# Patient Record
Sex: Male | Born: 1950 | Race: White | Hispanic: No | Marital: Married | Smoking: Current every day smoker
Health system: Southern US, Community
[De-identification: ages and names within clinical notes are randomized; demographics above are authoritative.]

## PROBLEM LIST (undated history)

## (undated) DIAGNOSIS — I252 Old myocardial infarction: Secondary | ICD-10-CM

## (undated) DIAGNOSIS — R030 Elevated blood-pressure reading, without diagnosis of hypertension: Secondary | ICD-10-CM

## (undated) HISTORY — DX: Old myocardial infarction: I25.2

## (undated) HISTORY — DX: Elevated blood-pressure reading, without diagnosis of hypertension: R03.0

---

## 2004-10-09 ENCOUNTER — Emergency Department (HOSPITAL_COMMUNITY): Admission: EM | Admit: 2004-10-09 | Discharge: 2004-10-09 | Payer: Self-pay | Admitting: Emergency Medicine

## 2004-12-19 ENCOUNTER — Emergency Department (HOSPITAL_COMMUNITY): Admission: EM | Admit: 2004-12-19 | Discharge: 2004-12-19 | Payer: Self-pay | Admitting: *Deleted

## 2006-12-11 ENCOUNTER — Emergency Department (HOSPITAL_COMMUNITY): Admission: EM | Admit: 2006-12-11 | Discharge: 2006-12-11 | Payer: Self-pay | Admitting: Emergency Medicine

## 2006-12-31 ENCOUNTER — Emergency Department (HOSPITAL_COMMUNITY): Admission: EM | Admit: 2006-12-31 | Discharge: 2006-12-31 | Payer: Self-pay | Admitting: Emergency Medicine

## 2007-01-30 ENCOUNTER — Emergency Department (HOSPITAL_COMMUNITY): Admission: EM | Admit: 2007-01-30 | Discharge: 2007-01-30 | Payer: Self-pay | Admitting: Emergency Medicine

## 2007-08-22 ENCOUNTER — Emergency Department (HOSPITAL_COMMUNITY): Admission: EM | Admit: 2007-08-22 | Discharge: 2007-08-22 | Payer: Self-pay | Admitting: Family Medicine

## 2008-01-17 ENCOUNTER — Emergency Department (HOSPITAL_COMMUNITY): Admission: EM | Admit: 2008-01-17 | Discharge: 2008-01-17 | Payer: Self-pay | Admitting: Emergency Medicine

## 2008-01-19 ENCOUNTER — Emergency Department (HOSPITAL_COMMUNITY): Admission: EM | Admit: 2008-01-19 | Discharge: 2008-01-19 | Payer: Self-pay | Admitting: Emergency Medicine

## 2008-03-13 ENCOUNTER — Inpatient Hospital Stay (HOSPITAL_COMMUNITY): Admission: EM | Admit: 2008-03-13 | Discharge: 2008-03-16 | Payer: Self-pay | Admitting: Emergency Medicine

## 2008-03-14 ENCOUNTER — Encounter (INDEPENDENT_AMBULATORY_CARE_PROVIDER_SITE_OTHER): Payer: Self-pay | Admitting: Internal Medicine

## 2008-03-15 ENCOUNTER — Ambulatory Visit: Payer: Self-pay | Admitting: Vascular Surgery

## 2008-03-21 ENCOUNTER — Emergency Department (HOSPITAL_COMMUNITY): Admission: EM | Admit: 2008-03-21 | Discharge: 2008-03-21 | Payer: Self-pay | Admitting: Family Medicine

## 2009-04-19 ENCOUNTER — Ambulatory Visit: Payer: Self-pay | Admitting: Family Medicine

## 2009-08-22 ENCOUNTER — Emergency Department (HOSPITAL_BASED_OUTPATIENT_CLINIC_OR_DEPARTMENT_OTHER): Admission: EM | Admit: 2009-08-22 | Discharge: 2009-08-22 | Payer: Self-pay | Admitting: Emergency Medicine

## 2009-08-22 ENCOUNTER — Ambulatory Visit: Payer: Self-pay | Admitting: Diagnostic Radiology

## 2009-08-23 ENCOUNTER — Ambulatory Visit: Payer: Self-pay | Admitting: Diagnostic Radiology

## 2009-08-23 ENCOUNTER — Emergency Department (HOSPITAL_BASED_OUTPATIENT_CLINIC_OR_DEPARTMENT_OTHER): Admission: EM | Admit: 2009-08-23 | Discharge: 2009-08-23 | Payer: Self-pay | Admitting: Emergency Medicine

## 2009-10-20 ENCOUNTER — Ambulatory Visit: Payer: Self-pay | Admitting: Family Medicine

## 2009-11-14 ENCOUNTER — Emergency Department (HOSPITAL_BASED_OUTPATIENT_CLINIC_OR_DEPARTMENT_OTHER): Admission: EM | Admit: 2009-11-14 | Discharge: 2009-11-14 | Payer: Self-pay | Admitting: Emergency Medicine

## 2009-11-14 ENCOUNTER — Ambulatory Visit: Payer: Self-pay | Admitting: Radiology

## 2010-06-05 IMAGING — CR DG HAND COMPLETE 3+V*L*
3 series · 3 of 3 positions shown · non-contrast
Comparison: None.

CLINICAL DATA: Left and swelling and pain.

LEFT HAND - COMPLETE 3+ VIEW

[x hand pa left]
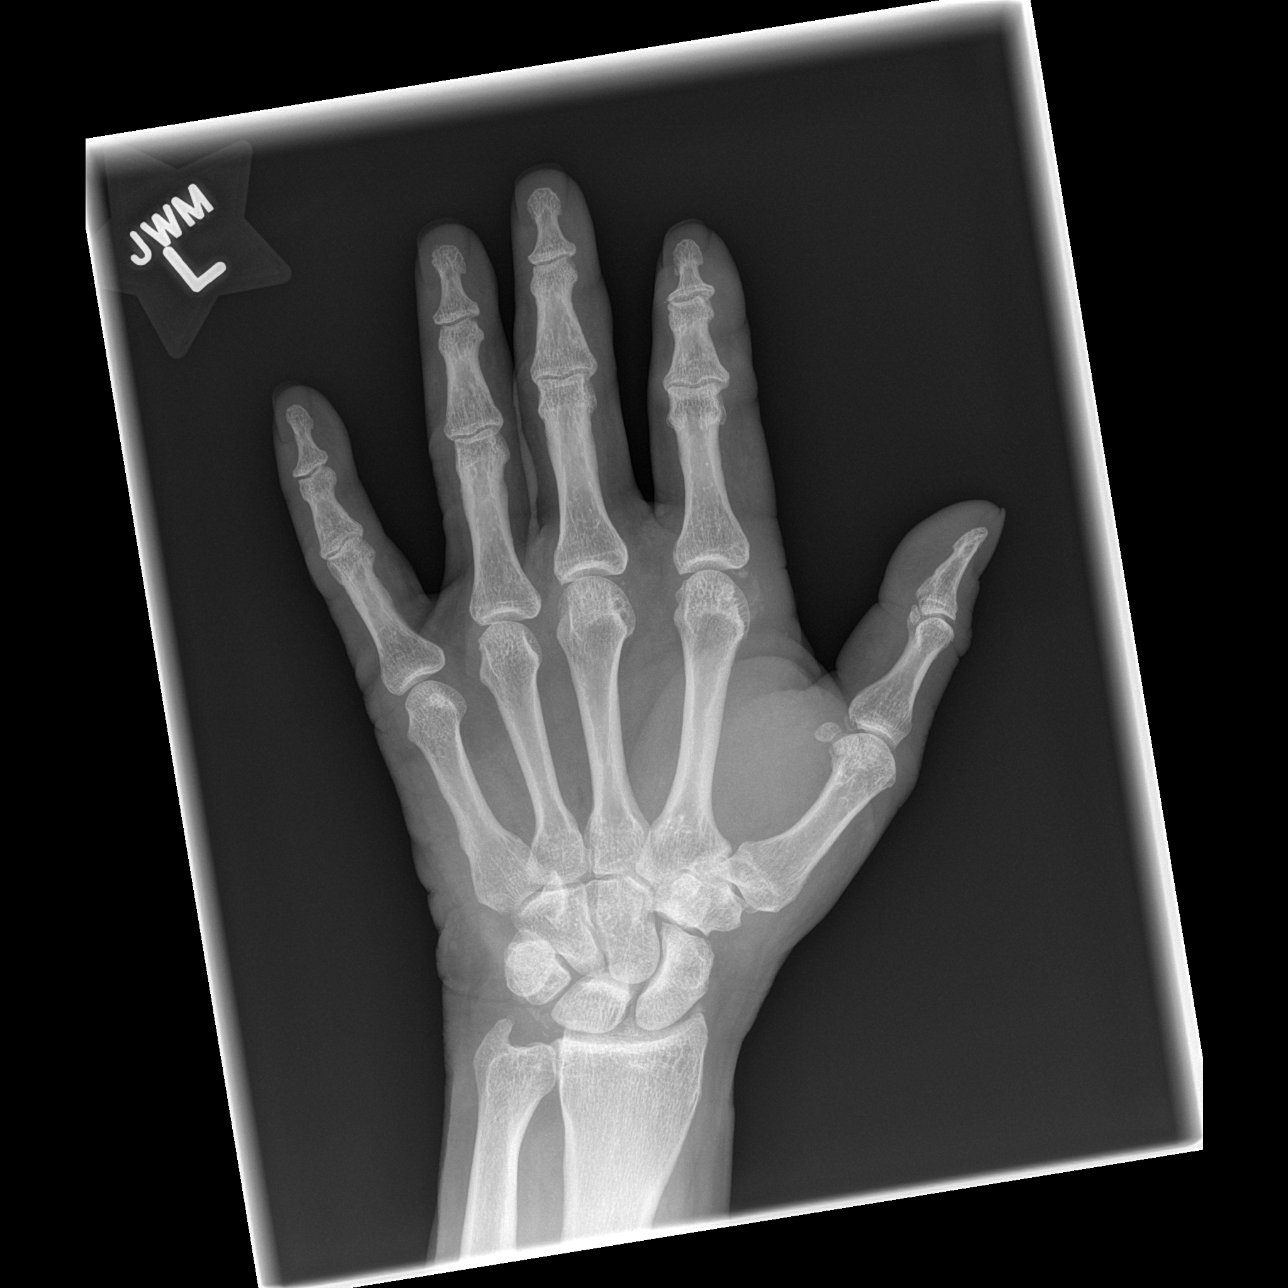

[x hand oblique left]
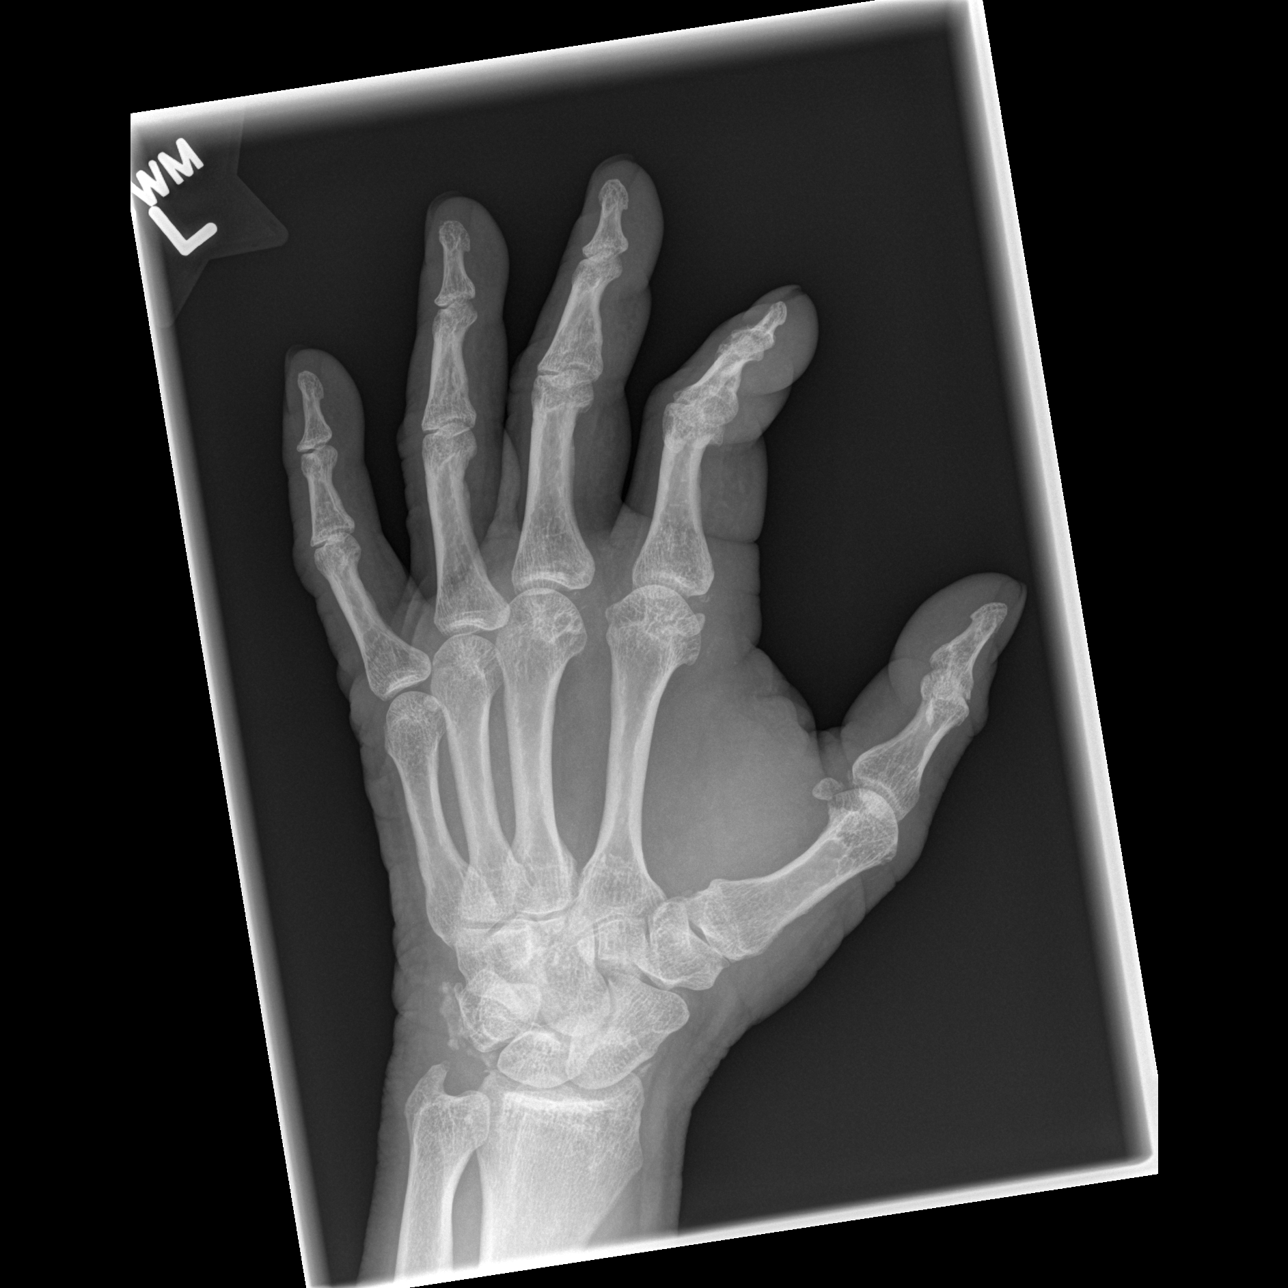

[x hand lat left]
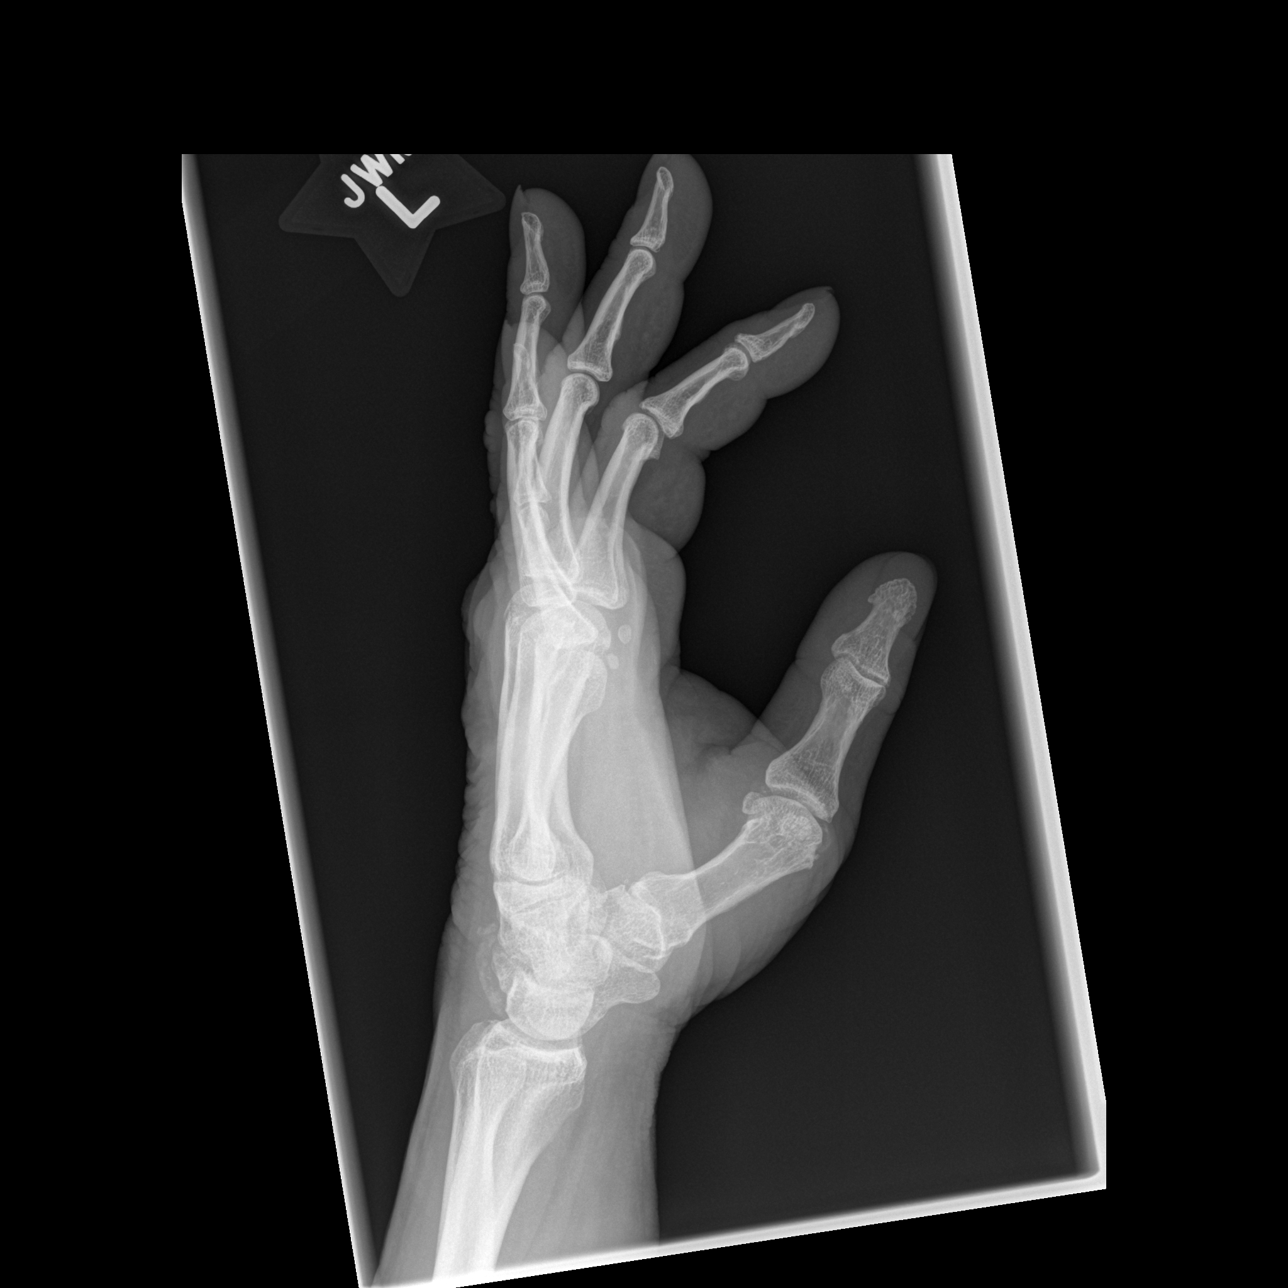

[3 of 3 positions shown; findings below may reference images not displayed]

FINDINGS: Calcific deposits adjacent to the second MCP joint may be
secondary to calcific periarthritis small nonspecific cystic like
foci in the third metacarpal head.  No joint space narrowing.
Calcification of the triangular fibrocartilage.
IMPRESSION: Combination of findings noted above may be due to CPPD.

## 2010-10-03 NOTE — Letter (Signed)
Summary: Out of Work  [facility]  [address]   [place], [place] [address]   Phone: [phone]  Fax: [phone]    October 20, 2009   Employee:  Carl Palmer    To Whom It May Concern:   For Medical reasons, please excuse the above named employee from work for the following dates:  Start:   20 Oct 2009  End:   23 Oct 2009 If you need additional information, please feel free to contact our office.         Sincerely,    Marvis Moeller DO

## 2010-10-03 NOTE — Assessment & Plan Note (Signed)
Summary: FEVER & COUGH/KH   Vital Signs:  Patient Profile:   60 Years Old Male CC:      fever, chills, fatigue, HAs, productive cough, N/VX 3 days Height:     69 inches Weight:      200 pounds O2 Sat:      96 % O2 treatment:    Room Air Temp:     100.9 degrees F oral Pulse rate:   115 / minute Pulse rhythm:   regular Resp:     14 per minute BP sitting:   110 / 76  (right arm) Cuff size:   regular  Pt. in pain?   yes    Location:   head    Type:       aching  Vitals Entered By: Lajean Saver, RN                   Updated Prior Medication List: DIOVAN HCT 80-12.5 MG TABS (VALSARTAN-HYDROCHLOROTHIAZIDE) once daily PREVACID 15 MG CPDR (LANSOPRAZOLE) once daily METFORMIN HCL 500 MG TABS (METFORMIN HCL)  * ASPIRIN 81MG  once a day LIPITOR 40 MG TABS (ATORVASTATIN CALCIUM) once daily DAYQUIL MULTI-SYMPTOM 30-325-10 MG/15ML LIQD (PSEUDOEPHEDRINE-APAP-DM) prn  Current Allergies (reviewed today): No known allergies History of Present Illness Chief Complaint: fever, chills, fatigue, HAs, productive cough, N/VX 3 days History of Present Illness: ONSET TUESDAY WITH FEVER TO 102 AND DRY COUGH. HAS BODYACHES. SOME RUNNY NOSE. NO SORE THROAT. TAKING NYQUIL. NO VOMITING OR DIARRHEA  REVIEW OF SYSTEMS Constitutional Symptoms       Complains of fever, chills, and fatigue.     Denies night sweats, weight loss, and weight gain.      Comments: body aches Eyes       Denies change in vision, eye pain, eye discharge, glasses, contact lenses, and eye surgery. Ear/Nose/Throat/Mouth       Complains of change in hearing, frequent runny nose, and sinus problems.      Denies hearing loss/aids, ear pain, ear discharge, dizziness, frequent nose bleeds, sore throat, hoarseness, and tooth pain or bleeding.      Comments: ears stopped up Respiratory       Complains of productive cough.      Denies dry cough, wheezing, shortness of breath, asthma, bronchitis, and emphysema/COPD.  Cardiovascular  Denies murmurs, chest pain, and tires easily with exhertion.    Gastrointestinal       Denies stomach pain, nausea/vomiting, diarrhea, constipation, blood in bowel movements, and indigestion. Genitourniary       Denies painful urination, kidney stones, and loss of urinary control. Neurological       Complains of headaches.      Denies paralysis, seizures, and fainting/blackouts. Musculoskeletal       Complains of muscle pain and joint pain.      Denies joint stiffness, decreased range of motion, redness, swelling, muscle weakness, and gout.  Skin       Denies bruising, unusual mles/lumps or sores, and hair/skin or nail changes.  Psych       Denies mood changes, temper/anger issues, anxiety/stress, speech problems, depression, and sleep problems. Other Comments: taken dayquil for symptoms   Past History:  Past Medical History: Reviewed history from 04/19/2009 and no changes required. diabetes elevated cholesterol elevated blood pressure  Past Surgical History: Reviewed history from 04/19/2009 and no changes required. ear surgery 20 years ago  Family History: Reviewed history from 04/19/2009 and no changes required. mother deceased father deceased brother adn sister alive  Social History: smokes 2 ppd denies drinking denies recreational drug use Physical Exam General appearance: well developed, well nourished, no acute distress Pupils: equal, round, reactive to light Nasal: CONGESTED Oral/Pharynx: tongue normal, posterior pharynx without erythema or exudate Neck: neck supple,  trachea midline, no masses Chest/Lungs: no rales, wheezes, or rhonchi bilateral, breath sounds equal without effort. CONGESTED COUGH Heart: regular rate and  rhythm, no murmur Extremities: normal extremities Skin: no obvious rashes or lesions Assessment New Problems: INFLUENZA (ICD-487.8)   Plan New Medications/Changes: CHERATUSSIN AC 100-10 MG/5ML SYRP (GUAIFENESIN-CODEINE)  #6 OZ x 0,  10/20/2009, Marvis Moeller DO  New Orders: Est. Patient Level III [51761]   Prescriptions: CHERATUSSIN AC 100-10 MG/5ML SYRP (GUAIFENESIN-CODEINE)   #6 OZ x 0   Entered and Authorized by:   Marvis Moeller DO   Signed by:   Marvis Moeller DO on 10/20/2009   Method used:   Print then Give to Patient   RxID:   [id number]   Patient Instructions: 1)  TYLENOL AT 12 AND 6, MOTRIN AT 3 ND 9. AVOID CAFFEINE AND MILK PRODUCTS. FOLLOW UP WITH YOUR PCP OR RETURN IF SYMPTOMS PERSIST OR WORSEN.

## 2011-01-16 NOTE — H&P (Signed)
NAMEATHANASIUS, [name]              ACCOUNT NO.:  [id number]   MEDICAL RECORD NO.:  [id number]          PATIENT TYPE:  EMS   LOCATION:  ED                           FACILITY[facility]  [facility]   PHYSICIAN:  Lonia Blood, M.D.DATE OF BIRTH:  May 20, 1951   DATE OF ADMISSION:  03/13/2008  DATE OF DISCHARGE:                              HISTORY & PHYSICAL   PRIMARY CARE PHYSICIAN:  Unassigned.   CHIEF COMPLAINT:  Left ankle swelling and erythema x6 days.   HISTORY OF PRESENT ILLNESS:  Mr. Carl Craze. Palmer is a very pleasant 60-  year-old diabetic with a reported history of gout who states that he has  suffered with a 6-day history of significant erythema and swelling about  the left ankle.  He does not remember specific trauma to the ankle to  include bug bites, scratches to the skin, or twisting or more  significant musculoskeletal-type trauma.  Regardless, he awoke last  Sunday to significant pain in the ankle with some swelling about the  lateral malleolus.  As time has progressed he began to note significant  erythema around both the medial and lateral aspect of the foot as well  as on the dorsal aspect of the foot as well.  He has noted episodic  shaking chills but has not checked his own temperature.  He has  occasionally used over-the-counter Tylenol for symptom relief.  He  presented to his primary care physician in [facility] at [facility] and  was given prescriptions for rifampin and Bactrim after an IM injection  of Rocephin.  Despite strict adherence to his antibiotic regimen the  patient states that the pain and swelling in his ankle and foot have  only increased.  He has continued to have episodic chills.  There has  been no eruption on the skin and no purulent discharge.  There is  significant pain about the ankle which limits his motion.   REVIEW OF SYSTEMS:  His comprehensive review of systems is unremarkable  with the exception of the positive elements noted in the history  of  present illness above.   PAST MEDICAL HISTORY:  1. Diabetes mellitus - poorly controlled.  2. Hypertension - poorly controlled.  3. Reported history of gout - uric acid normal at this time.  4. Excess of tobacco abuse - 2-3 pack per day history for multiple      years.  5. A remote history of surgical repair of the tympanic membranes      secondary to spontaneous rupture.  6.  No prior surgical history      otherwise.   OUTPATIENT MEDICATIONS:  Doses unclear.  1. Diovan.  2. Lipitor.  3. Januvia.  4. Bactrim DS b.i.d.  5. Rifampin 300 mg b.i.d.  6. Avandamet.   ALLERGIES:  No known drug allergies.   FAMILY HISTORY:  Noncontributory.   SOCIAL HISTORY:  [place]  He is  married.  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  He occasionally partakes alcohol  but not to excess.   DATA REVIEWED:  Sodium is low at  131, potassium chloride and bicarb are  normal.  BUN is borderline elevated at 19 with a creatinine of 1.05,  serum glucose is markedly elevated at 279 with normal calcium.  White  count is elevated at 12,000.  MCV is normal.  Hemoglobin is normal.  Platelet count is normal.  Uric acid level is low normal at 3.7.  Sed  rate is elevated at 46.  Left ankle x-rays raise the question of  possible osteomyelitis of the lateral talus as per the radiologist with  suggestion of bone scan versus MRI for further evaluation.   PHYSICAL EXAMINATION:  Temperature 98.6, blood pressure 139/78, heart  rate 110, respiratory rate 18, O2 sat 96% on room air.  CBG is 246.  GENERAL:  A well-developed, well-nourished gentleman no acute  respiratory distress.  HEENT:  Normocephalic, atraumatic.  Pupils equal, round, reactive to  light and accommodation.  Extraocular muscles intact bilaterally.  OC/OP  clear.  NECK:  No JVD.  LUNGS:  Clear to auscultation bilaterally without wheezes or rhonchi.  CARDIOVASCULAR:  Regular rate and rhythm without murmur, gallop or  rub  with a normal S1 and S2.  ABDOMEN:  Nontender, nondistended, soft.  Bowel sounds present.  No  hepatosplenomegaly.  No rebound, no ascites.  EXTREMITIES:  The left ankle is significantly edematous/swollen.  There  is no cutaneous edema but there is significant erythema about the entire  ankle.  The ankle is very tender to touch and the patient guards against  full clinical exam.  Dorsalis pedis pulses are 1+ bilaterally.  There is  no evidence of necrosis.  There are multiple lower extremity superficial  cutaneous wounds but no wounds in the immediate area of the  erythema/cellulitis.  The ankle is warm to touch, as is the dorsal  aspect of the foot.  There is no crepitance of the skin.  There is no  streaking up the calf at this time.  NEUROLOGIC:  Alert and oriented x4, nonfocal neurologic exam.   IMPRESSION AND PLAN:  1. Left ankle swelling with severe erythema - differential at this      time includes the possibility of  cellulitis versus a more complex      infection to include a deep abscess or an osteomyelitis.      Orthopedic surgery has been consulted in the ER and has evaluated      the patient.  They will follow along with [place] as needed as we pursue      evaluation further.  Broad-spectrum antibiotics for the probability      of a multi organism infection are administered in this diabetic.      We will proceed with an MRI as well as ABIs and further      intervention will be carried out as appropriate.  2. Uncontrolled diabetes - in the acute setting the patient's diabetes      will need to be strictly controlled.  Blood sugar must be      maintained at less than 150.  We will hold all of the patient's      p.o. medications and we will initiate Lantus insulin as well as a      strict sliding scale insulin.  It is likely the patient will      require further titration of his diabetic regimen.  It is not clear      if the patient is on a routine regimen of aspirin therapy.   We have  counseled him on this and will initiate this during his hospital      stay and I suggest that he continue this on a daily basis after his      discharge.  3. Hypertension - the patient's blood pressure is poorly controlled at      the present time.  We will initiate his usual ARB and follow his      blood pressure closely.  Additional agents may be required.  4. Tobacco abuse - the patient smokes extensively.  The patient has      been counseled as to the multiple deleterious      effects of ongoing tobacco abuse.  He has been counseled to the      direct connection between vascular disease, peripheral vascular      disease, poor wound healing, and a propensity for further      complications of his diabetes with ongoing tobacco abuse.  We will      place a nicotine patch during his hospital stay.      Lonia Blood, M.D.  Electronically Signed     JTM/MEDQ  D:  03/13/2008  T:  03/13/2008  Job:  [id number]

## 2011-01-16 NOTE — Consult Note (Signed)
Carl Palmer, Carl Palmer              ACCOUNT NO.:  [id number]   MEDICAL RECORD NO.:  [id number]          PATIENT TYPE:  EMS   LOCATION:  ED                           FACILITY:  [facility]   PHYSICIAN:  Ollen Gross, M.D.    DATE OF BIRTH:  [birth year]   DATE OF CONSULTATION:  03/13/2008  DATE OF DISCHARGE:                                 CONSULTATION   REASON FOR CONSULTATION:  Left foot swelling and erythema.   HISTORY OF PRESENT ILLNESS:  Mr. Champine is a 60 year old male with  noninsulin-dependent diabetes and gout, in today with an approximately 5-  6-day history of pain and swelling in his left ankle.  He does not  recall any specific injury which would have led to this.  He woke up  with some swelling and a little redness.  Over the ensuing 2-3 days it  got worse.  He denies any fever but has had a few episodes of chills.  He did have a dose of IV antibiotics at an urgent care and did not  notice any response to that.  He said he has had gout in the other leg  before and this does feel similar to a gouty episode.  He came into the  emergency room today because of no response to prior treatment and  progressive worsening of his condition.  He denies any calf pain or  thigh pain.  He is not having any shortness of breath or systemic  symptoms.   His medical history is significant for poorly-controlled noninsulin-  dependent diabetes and gout.   PHYSICAL EXAMINATION:  A very pleasant, well-developed male, alert and  oriented, in no apparent distress.  Evaluation of his left foot shows diffuse swelling in the foot.  He has  erythema over the lateral and medial malleoli.  He has significant  discomfort with any attempted palpation of the foot, any attempted  movement of the ankle.  I did not palpate any significant ankle  effusion.  He has intact pulses and sensation in the foot.  He has no  evidence of any tenderness in his calf and his Homans sign is negative.  There is no popliteal  tenderness either.   RADIOGRAPHS:  Three views of the left ankle:  No fracture or acute  trauma.  Radiology read that there is possible osteomyelitis, lateral  process of the talus, but to me this looks more like chronic attritional  changes from a possible prior sprain of the ankle.   ASSESSMENT:  Left foot swelling and erythema.  He either has a  significant gout flare, which is unlikely given his uric acid level of  3.7, or has a cellulitis and possible deep infection.  I agree with  admitting him, providing IV antibiotics and obtaining an MR to rule out  an abscess.  If indeed he has an abscess, then we could address that  with surgical drainage.  If there is not an abscess, then the treatment  of choice would be elevation and IV antibiotics.  We will follow along  with the hospitalist team.  Ollen Gross, M.D.  Electronically Signed     FA/MEDQ  D:  03/13/2008  T:  03/13/2008  Job:  [id number]   cc:   Lonia Blood, M.D.

## 2011-01-19 NOTE — Discharge Summary (Signed)
Carl Palmer, Carl Palmer              ACCOUNT NO.:  [id number]   MEDICAL RECORD NO.:  [id number]          PATIENT TYPE:  INP   LOCATION:  1303                         FACILITY:  [facility]   PHYSICIAN:  Lonia Blood, M.D.      DATE OF BIRTH:  1951-01-25   DATE OF ADMISSION:  03/13/2008  DATE OF DISCHARGE:  03/16/2008                               DISCHARGE SUMMARY   PRIMARY CARE PHYSICIAN:  The patient's primary care physician is in [facility], so he is unassigned to [place].   DISCHARGE DIAGNOSES:  1. Left ankle cellulitis.  2. Uncontrolled diabetes type 2.  3. Gout.  4. Hypertension.  5. History of tobacco abuse.  6. Hyponatremia, but presumed to be pseudo.   DISCHARGE MEDICATIONS:  1. Doxycycline 100 mg p.o. b.i.d. for 1 week.  2. Diovan HCT 12.5  daily.  3. Lipitor 20-mg tablet daily.  4. Januvia 30mg  mg daily.   DISPOSITION:  The patient is to complete antibiotics and follow up with  his primary care physician in about a week.  His cultures did not grow  anything significant.   CONSULTATIONS:  Dr. Ollen Gross, orthopedics, with no surgical  intervention recommended.   PROCEDURES PERFORMED:  1. Lower extremity Doppler ultrasound and ABIs that were found to be      normal bilaterally.  2. MRI/MRA of the lower extremities that showed cellulitis of the      ankle and foot with low-level edema and enhancement tracking along      the plantar musculature.  Mild Achilles tendinopathy, small      tibiotalar joint effusion.   DISPOSITION:  The patient was discharged to complete medications and  follow up with his primary care physician.   BRIEF HISTORY AND PHYSICAL:  Please refer to dictated history and  physical on admission.  In short, however, the patient is a 60 year old  diabetic followed ups usually at [facility].  He presented to [place] here  with left ankle swelling, erythema and pain.  The patient was  subsequently evaluated in the ER and admitted with diagnosis of left  ankle  cellulitis to rule out osteomyelitis.   HOSPITAL COURSE:  1. Left ankle cellulitis.  The patient was admitted, started on IV      vancomycin and Zosyn.  His uric acid was checked.  It was only 3.7.      ESR 46.  He had an MRI of the ankle that showed no evidence of      osteo, mainly cellulitis.  Dr. Ollen Gross of orthopedics was      consulted and evaluated the patient as well.  Recommendation was to      continue antibiotics and no surgical intervention warranted since      there was no osteo.  2. Diabetes.  The patient's diabetes was not well controlled at the      time of admission.  He was placed on insulin and continued on his      home medications.  He will discharge on Januvia as per prior to      hospitalization with instructions  to use insulin as needed.  3. Hypertension.  Blood pressure was controlled in the hospital.  4. Gout.  Again, the patient is not taking anything actively for his      gout and his uric acid level was only 3.7, which showed no evidence      of acute gouty attack at this point.  5. Dyslipidemia.  The patient was taking Lipitor prior to the      admission and was asked to continue that as much as possible.  6. Tobacco abuse.  The patient was counseled on tobacco use and given      nicotine patch in the hospital.  He is considering quitting.   Otherwise, the patient will follow up with his primary care physician at  [facility].      Lonia Blood, M.D.  Electronically Signed     LG/MEDQ  D:  04/11/2008  T:  04/11/2008  Job:  [id number]

## 2011-05-30 LAB — DIFFERENTIAL
Basophils Absolute: 0
Basophils Absolute: 0
Basophils Relative: 0
Eosinophils Absolute: 0.2
Eosinophils Absolute: 0.2
Eosinophils Relative: 2
Lymphocytes Relative: 21
Lymphocytes Relative: 13
Lymphs Abs: 2
Monocytes Absolute: 0.7
Monocytes Relative: 7
Neutro Abs: 6.9
Neutro Abs: 9.1 — ABNORMAL HIGH
Neutrophils Relative %: 70
Neutrophils Relative %: 77

## 2011-05-30 LAB — POCT I-STAT, CHEM 8
BUN: 20
Calcium, Ion: 1.13
Chloride: 99
Creatinine, Ser: 1.1
Glucose, Bld: 286 — ABNORMAL HIGH
HCT: 43
Hemoglobin: 14.6
Potassium: 4.3
Sodium: 134 — ABNORMAL LOW
TCO2: 27

## 2011-05-30 LAB — CBC
MCHC: 35.8
MCV: 89.1
Platelets: 237
RDW: 12.5

## 2011-05-30 LAB — URIC ACID
Uric Acid, Serum: 4.3
Uric Acid, Serum: 4

## 2011-05-31 LAB — DIFFERENTIAL
Eosinophils Absolute: 0
Eosinophils Absolute: 0.1
Lymphocytes Relative: 14
Lymphocytes Relative: 9 — ABNORMAL LOW
Lymphs Abs: 1.1
Lymphs Abs: 1.3
Monocytes Absolute: 0.9
Monocytes Relative: 12
Monocytes Relative: 8
Neutro Abs: 9.8 — ABNORMAL HIGH
Neutrophils Relative %: 74
Neutrophils Relative %: 82 — ABNORMAL HIGH

## 2011-05-31 LAB — BASIC METABOLIC PANEL
BUN: 19
CO2: 26
Calcium: 8.9
Chloride: 94 — ABNORMAL LOW
Chloride: 98
GFR calc Af Amer: >60
GFR calc non Af Amer: >60
Potassium: 4.2
Potassium: 4.5

## 2011-05-31 LAB — CULTURE, BLOOD (ROUTINE X 2)
Culture: NO GROWTH
Culture: NO GROWTH

## 2011-05-31 LAB — CBC
HCT: 37.3 — ABNORMAL LOW
Hemoglobin: 15.2
MCHC: 34.3
MCV: 88.8
MCV: 89
Platelets: 189
Platelets: 217
RBC: 4.2 — ABNORMAL LOW
RBC: 4.92
RDW: 12.1
RDW: 12.8
WBC: 10

## 2011-05-31 LAB — VANCOMYCIN, TROUGH: Vancomycin Tr: 9.5

## 2011-05-31 LAB — URINALYSIS, ROUTINE W REFLEX MICROSCOPIC
Leukocytes, UA: NEGATIVE
Nitrite: NEGATIVE
Protein, ur: 30 — AB
Specific Gravity, Urine: 1.022
Urobilinogen, UA: 0.2

## 2011-05-31 LAB — HEMOGLOBIN A1C: Mean Plasma Glucose: 264

## 2011-05-31 LAB — COMPREHENSIVE METABOLIC PANEL
ALT: 14
AST: 14
Calcium: 8.1 — ABNORMAL LOW
GFR calc Af Amer: >60
Glucose, Bld: 184 — ABNORMAL HIGH
Sodium: 130 — ABNORMAL LOW
Total Protein: 6

## 2011-05-31 LAB — URIC ACID: Uric Acid, Serum: 3.7 — ABNORMAL LOW

## 2011-05-31 LAB — LIPID PANEL
Cholesterol: 149
Total CHOL/HDL Ratio: 4.5
VLDL: 23

## 2011-05-31 LAB — URINE CULTURE

## 2011-05-31 LAB — PROTIME-INR
INR: 1
Prothrombin Time: 13.5

## 2011-05-31 LAB — URINE MICROSCOPIC-ADD ON

## 2011-05-31 LAB — PHOSPHORUS: Phosphorus: 2.4

## 2011-06-01 LAB — BODY FLUID CULTURE: Culture: NO GROWTH

## 2011-06-01 LAB — DIFFERENTIAL
Eosinophils Absolute: 0.2
Eosinophils Relative: 2
Lymphocytes Relative: 18
Lymphs Abs: 1.9
Monocytes Absolute: 0.8

## 2011-06-01 LAB — SYNOVIAL CELL COUNT + DIFF, W/ CRYSTALS
Crystals, Fluid: NONE SEEN
Eosinophils-Synovial: 0
Lymphocytes-Synovial Fld: 24 — ABNORMAL HIGH
Monocyte-Macrophage-Synovial Fluid: 60
WBC, Synovial: 975 — ABNORMAL HIGH

## 2011-06-01 LAB — CBC
HCT: 38.5 — ABNORMAL LOW
Hemoglobin: 13.3
MCV: 89.2
Platelets: 395
RDW: 11.8
WBC: 10.4

## 2011-06-01 LAB — SEDIMENTATION RATE: Sed Rate: 67 — ABNORMAL HIGH

## 2012-10-17 ENCOUNTER — Ambulatory Visit (INDEPENDENT_AMBULATORY_CARE_PROVIDER_SITE_OTHER): Payer: Self-pay | Admitting: Family Medicine

## 2012-10-17 ENCOUNTER — Encounter: Payer: Self-pay | Admitting: Family Medicine

## 2012-10-17 VITALS — BP 141/73 | HR 113 | Ht 68.75 in | Wt 197.0 lb

## 2012-10-17 DIAGNOSIS — E1149 Type 2 diabetes mellitus with other diabetic neurological complication: Secondary | ICD-10-CM

## 2012-10-17 DIAGNOSIS — R358 Other polyuria: Secondary | ICD-10-CM

## 2012-10-17 DIAGNOSIS — E119 Type 2 diabetes mellitus without complications: Secondary | ICD-10-CM | POA: Insufficient documentation

## 2012-10-17 DIAGNOSIS — E1142 Type 2 diabetes mellitus with diabetic polyneuropathy: Secondary | ICD-10-CM

## 2012-10-17 DIAGNOSIS — Z720 Tobacco use: Secondary | ICD-10-CM

## 2012-10-17 DIAGNOSIS — R030 Elevated blood-pressure reading, without diagnosis of hypertension: Secondary | ICD-10-CM

## 2012-10-17 DIAGNOSIS — Z13220 Encounter for screening for lipoid disorders: Secondary | ICD-10-CM

## 2012-10-17 DIAGNOSIS — I252 Old myocardial infarction: Secondary | ICD-10-CM | POA: Insufficient documentation

## 2012-10-17 DIAGNOSIS — N529 Male erectile dysfunction, unspecified: Secondary | ICD-10-CM

## 2012-10-17 DIAGNOSIS — F17200 Nicotine dependence, unspecified, uncomplicated: Secondary | ICD-10-CM

## 2012-10-17 DIAGNOSIS — IMO0001 Reserved for inherently not codable concepts without codable children: Secondary | ICD-10-CM

## 2012-10-17 HISTORY — DX: Old myocardial infarction: I25.2

## 2012-10-17 HISTORY — DX: Reserved for inherently not codable concepts without codable children: IMO0001

## 2012-10-17 LAB — POCT GLYCOSYLATED HEMOGLOBIN (HGB A1C): Hemoglobin A1C: 12

## 2012-10-17 MED ORDER — TADALAFIL 20 MG PO TABS: 20.0000 mg | ORAL_TABLET | Freq: Every day | ORAL | Status: DC | PRN

## 2012-10-17 MED ORDER — GLIPIZIDE-METFORMIN HCL 5-500 MG PO TABS: 2.0000 | ORAL_TABLET | Freq: Two times a day (BID) | ORAL | Status: DC

## 2012-10-17 NOTE — Progress Notes (Signed)
CC: Carl Palmer is a 62 y.o. male is here for Establish Care and Diabetes   Subjective: HPI:  Pleasant 62 year old here to establish care  History of type 2 diabetes: He has been taking metformin and sulfonylurea for years. He ran out of this medication approximately one month ago. Since then he is noticed polydipsia and polyuria.  He tells me for over a year he says mild burning and numbness in his feet that have gotten worse since stopping the medication. The symptoms are worse at rest and absent when active. He denies poorly healing wounds nor lesions of the feet. He denies decreased vision. He tries to watch what he, no formal exercise program. He would like to avoid insulin due to work restrictions if he were to start. He's uncertain of his last A1c.  He has a remote history of hypertension. He is been on blood pressure medications in the past but is unsure of the names. No outside blood pressures to report. He denies headaches, exertional chest pain, trouble climbing a flight of stairs, shortness of breath, peripheral edema nor PND.  He tells me he has trouble maintaining an erection and sometimes initiating erection. He and his wife describe his libido as robust.  This has been present for almost a year. He once tried to viagra and gave him a headache without any help with his erection.  He can climb a flight of stairs without getting short of breath or having chest pain. He denies any recent exertional chest pain. He denies dysuria, penile discharge,, testicular pain, nor any other genitourinary complaints  He is been a smoker since age 9. 1 year ago he smoked 2-3 packs a day and has cut back down to one pack a day also using liquid nicotine vapor.  He has never tried to quit. He describes himself as somewhat who is"ready but not ready to quit".  Review of Systems - General ROS: negative for - chills, fever, night sweats, weight gain or weight loss Ophthalmic ROS: negative for -  decreased vision Psychological ROS: negative for - anxiety or depression ENT ROS: negative for - hearing change, nasal congestion, tinnitus or allergies Hematological and Lymphatic ROS: negative for - bleeding problems, bruising or swollen lymph nodes Breast ROS: negative Respiratory ROS: no cough, shortness of breath, or wheezing Cardiovascular ROS: no chest pain or dyspnea on exertion Gastrointestinal ROS: no abdominal pain, change in bowel habits, or black or bloody stools Genito-Urinary ROS: negative for - genital discharge, genital ulcers, incontinence or abnormal bleeding from genitals Musculoskeletal ROS: negative for - joint pain or muscle pain Neurological ROS: negative for - headaches or memory loss Dermatological ROS: negative for lumps, mole changes, rash and skin lesion changes  Past Medical History  Diagnosis Date  . History of myocardial infarction 10/17/2012  . Elevated blood pressure 10/17/2012     History reviewed. No pertinent family history.   History  Substance Use Topics  . Smoking status: Not on file  . Smokeless tobacco: Not on file  . Alcohol Use: Not on file     Objective: Filed Vitals:   10/17/12 1436  BP: 141/73  Pulse: 113    General: Alert and Oriented, No Acute Distress HEENT: Pupils equal, round, reactive to light. Conjunctivae clear.  External ears unremarkable, canals clear with intact TMs with appropriate landmarks.  Middle ear appears open without effusion. Pink inferior turbinates.  Moist mucous membranes, pharynx without inflammation nor lesions.  Neck supple without palpable lymphadenopathy nor abnormal  masses. Lungs: Clear to auscultation bilaterally, no wheezing/ronchi/rales.  Comfortable work of breathing. Good air movement. Cardiac: Regular rate and rhythm. Normal S1/S2.  No murmurs, rubs, nor gallops.   Abdomen: Normal bowel sounds, soft and non tender without palpable masses. Extremities: No peripheral edema.  Strong peripheral  pulses.  Mental Status: No depression, anxiety, nor agitation. Skin: Warm and dry.  Assessment & Plan: [name] was seen today for establish care and diabetes.  Diagnoses and associated orders for this visit:  Diabetes - POCT HgB A1C - glipiZIDE-metformin (METAGLIP) 5-500 MG per tablet; Take 2 tablets by mouth 2 (two) times daily before a meal.  Type 2 diabetes mellitus - Microalbumin / creatinine urine ratio - BASIC METABOLIC PANEL WITH GFR - aspirin EC 325 MG tablet; Take 1 tablet (325 mg total) by mouth daily.  Elevated blood pressure  Need for lipid screening - Lipid panel  Erectile dysfunction - tadalafil (CIALIS) 20 MG tablet; Take 1 tablet (20 mg total) by mouth daily as needed for erectile dysfunction.  Polyuria  Diabetic peripheral neuropathy associated with type 2 diabetes mellitus  Tobacco abuse  Other Orders - Discontinue: glipiZIDE-metformin (METAGLIP) 5-500 MG per tablet; Take 1 tablet by mouth 2 (two) times daily before a meal. - Cancel: glipiZIDE-metformin (METAGLIP) 5-500 MG per tablet; Take 1 tablet by mouth 2 (two) times daily before a meal.    Take 2 diabetes: Uncontrolled, A1c of 12 today with symptoms of hyperglycemia. Will restart metformin and glipizide. Prepped the patient that we may need to step up therapy if goal A1c of 7.0 not reached in 3 months. I've asked him to start an aspirin a day. Obtain urine studies and GFR as above.  Elevated blood pressure: Uncontrolled, discussed need for antihypertensive therapy if elevated at his next visit. Discussed reduction in salt to help with blood pressure erectile dysfunction: Uncontrolled, discussed that diabetes and poor blood sugar are likely contributing to this to some degree. Samples of Cialis provided Tobacco abuse: Discussed benefits of quitting and methods to quit including but not limited to patches, gum, medications. Discussed importance of having a quit date before starting any of the above plans.  Patient is somewhat interested in patches over-the-counter   polyuria: Most likely do to uncontrolled blood sugars, if not improved with tighter blood sugar control we'll discuss possibility of BPH Diabetic peripheral neuropathy: Uncontrolled, discussed benefits of tight blood sugar control with his peripheral neuropathy symptoms. We will hold off on neuropathic pain meds until symptoms are addressed with tighter blood sugar control Overdue for lipid profile, goal LDL of less than 70 given his history of a myocardial infarction   Return in about 4 weeks (around 11/14/2012) for cpe.

## 2012-10-20 LAB — LIPID PANEL
Cholesterol: 214 mg/dL — ABNORMAL HIGH (ref 0–200)
LDL Cholesterol: 135 mg/dL — ABNORMAL HIGH (ref 0–99)
Total CHOL/HDL Ratio: 5.5 Ratio
VLDL: 40 mg/dL (ref 0–40)

## 2012-10-20 LAB — BASIC METABOLIC PANEL WITH GFR
BUN: 21 mg/dL (ref 6–23)
Calcium: 8.8 mg/dL (ref 8.4–10.5)
Chloride: 101 mEq/L (ref 96–112)
Creat: 0.86 mg/dL (ref 0.50–1.35)
GFR, Est Non African American: >89 mL/min

## 2012-10-21 ENCOUNTER — Encounter: Payer: Self-pay | Admitting: Family Medicine

## 2012-10-21 ENCOUNTER — Telehealth: Payer: Self-pay | Admitting: Family Medicine

## 2012-10-21 DIAGNOSIS — R809 Proteinuria, unspecified: Secondary | ICD-10-CM | POA: Insufficient documentation

## 2012-10-21 DIAGNOSIS — E785 Hyperlipidemia, unspecified: Secondary | ICD-10-CM

## 2012-10-21 DIAGNOSIS — I10 Essential (primary) hypertension: Secondary | ICD-10-CM

## 2012-10-21 LAB — MICROALBUMIN / CREATININE URINE RATIO: Microalb, Ur: 75.04 mg/dL — ABNORMAL HIGH (ref 0.00–1.89)

## 2012-10-21 MED ORDER — LISINOPRIL 20 MG PO TABS: 20.0000 mg | ORAL_TABLET | Freq: Every day | ORAL | Status: DC

## 2012-10-21 MED ORDER — ATORVASTATIN CALCIUM 40 MG PO TABS: 40.0000 mg | ORAL_TABLET | Freq: Every day | ORAL | Status: AC

## 2012-10-21 NOTE — Telephone Encounter (Signed)
Pt.notified

## 2012-10-21 NOTE — Telephone Encounter (Signed)
Sue Lush, Will you please let Carl Palmer know that his urine studies show that his diabetes has caused some damage to his kidneys, I've sent in an Rx for lisinopril which will protect further damage and will also help with blood pressure.  His LDL-cholesterol of 135 was above a goal of less than 70.  I would encourage him to start Lipitor which is a cholesterol medication that will help get him closer to goal and protect him from heart attacks in the future.  I would like him to follow up in 3 months, this would be in addition to his CPE scheduled for march.

## 2012-11-21 ENCOUNTER — Encounter: Payer: Commercial Managed Care - PPO | Admitting: Family Medicine

## 2013-04-10 ENCOUNTER — Ambulatory Visit (INDEPENDENT_AMBULATORY_CARE_PROVIDER_SITE_OTHER): Payer: Commercial Managed Care - PPO | Admitting: Family Medicine

## 2013-04-10 ENCOUNTER — Ambulatory Visit: Payer: Commercial Managed Care - PPO | Admitting: Family Medicine

## 2013-04-10 ENCOUNTER — Encounter: Payer: Self-pay | Admitting: Family Medicine

## 2013-04-10 VITALS — BP 141/85 | HR 80 | Temp 98.6°F | Wt 197.0 lb

## 2013-04-10 DIAGNOSIS — J209 Acute bronchitis, unspecified: Secondary | ICD-10-CM

## 2013-04-10 DIAGNOSIS — N529 Male erectile dysfunction, unspecified: Secondary | ICD-10-CM

## 2013-04-10 DIAGNOSIS — E785 Hyperlipidemia, unspecified: Secondary | ICD-10-CM

## 2013-04-10 DIAGNOSIS — E119 Type 2 diabetes mellitus without complications: Secondary | ICD-10-CM

## 2013-04-10 MED ORDER — AZITHROMYCIN 250 MG PO TABS: ORAL_TABLET | ORAL | Status: AC

## 2013-04-10 MED ORDER — VARDENAFIL HCL 20 MG PO TABS: ORAL_TABLET | ORAL | Status: DC

## 2013-04-10 NOTE — Progress Notes (Signed)
CC: Carl Palmer is a 62 y.o. male is here for Shortness of Breath   Subjective: HPI:  Patient complains of productive cough that has been present for the past 5 days worsening on a daily basis is yellow without blood. Associated with sensation of chest congestion but no other chest pain. Mild shortness of breath with exertion. Nothing makes the symptoms better or worse present all hours a day moderate severity. Subjective fevers and fatigue. Denies motor or sensory disturbances, confusion, wheezing, rashes, myalgias nor upper respiratory complaints  Patient requesting samples for Cialis. He denies chest pain with exertion. He has trouble initiating erection without Cialis, he has had complete resolution of erectile dysfunction provided he takes Cialis.  Type 2 diabetes: Continues to take glipizide-metformin a daily basis. Few weeks ago blood sugar was up to 400 he had a hypoglycemic episode last week at 50. He is unsure fasting blood sugars or postprandials. Denies polyuria polyphasia or polydipsia  Hyperlipidemia: Since I saw him last he's been on Lipitor without myalgias right upper quadrant pain nor skin or scleral discoloration  Review Of Systems Outlined In HPI  Past Medical History  Diagnosis Date  . History of myocardial infarction 10/17/2012  . Elevated blood pressure 10/17/2012     Family History  Problem Relation Age of Onset  . Hypertension Father      History  Substance Use Topics  . Smoking status: Not on file  . Smokeless tobacco: Not on file  . Alcohol Use: Not on file     Objective: Filed Vitals:   04/10/13 1121  BP: 141/85  Pulse: 80  Temp: 98.6 F (37 C)    General: Alert and Oriented, No Acute Distress HEENT: Pupils equal, round, reactive to light. Conjunctivae clear.  External ears unremarkable, canals clear with intact TMs with appropriate landmarks.  Middle ear appears open without effusion. Pink inferior turbinates.  Moist mucous membranes,  pharynx without inflammation nor lesions.  Neck supple without palpable lymphadenopathy nor abnormal masses. Lungs: Mild central rhonchi without rales nor wheezing no signs of consolidation he does have distant breath sounds Cardiac: Regular rate and rhythm. Normal S1/S2.  No murmurs, rubs, nor gallops.   Extremities: No peripheral edema.  Strong peripheral pulses.  Mental Status: No depression, anxiety, nor agitation. Skin: Warm and dry.  Assessment & Plan: Carl Palmer was seen today for shortness of breath.  Diagnoses and associated orders for this visit:  Hyperlipidemia LDL goal < 70 - Lipid panel  Type 2 diabetes mellitus - Hemoglobin A1c  Acute bronchitis - azithromycin (ZITHROMAX) 250 MG tablet; Take two tabs at once on day 1, then one tab daily on days 2-5.  Erectile dysfunction - vardenafil (LEVITRA) 20 MG tablet; Half tab 30 minutes prior to sexual activity.    Hyperlipidemia uncontrolled I like him to continue Lipitor while we are awaiting his fasting lipid panel Type 2 diabetes: Uncontrolled chronic condition I would like to get an A1c we will adjust anti-hyperglycemics Acute bronchitis: Start azithromycin he was prescribed albuterol by his employer physician, I counseled him to consider using this only that helps the cough as he is not wheezing Erectile dysfunction: Controlled we do not have Cialis samples he will try Levitra he does not want a formal prescription of either yet  Return in about 4 weeks (around 05/08/2013).

## 2013-04-21 ENCOUNTER — Ambulatory Visit (INDEPENDENT_AMBULATORY_CARE_PROVIDER_SITE_OTHER): Payer: Commercial Managed Care - PPO | Admitting: Family Medicine

## 2013-04-21 ENCOUNTER — Telehealth: Payer: Self-pay | Admitting: *Deleted

## 2013-04-21 ENCOUNTER — Encounter: Payer: Self-pay | Admitting: Family Medicine

## 2013-04-21 ENCOUNTER — Ambulatory Visit (INDEPENDENT_AMBULATORY_CARE_PROVIDER_SITE_OTHER): Payer: Commercial Managed Care - PPO

## 2013-04-21 VITALS — BP 123/75 | HR 101 | Temp 98.1°F | Wt 204.0 lb

## 2013-04-21 DIAGNOSIS — R0902 Hypoxemia: Secondary | ICD-10-CM

## 2013-04-21 DIAGNOSIS — E119 Type 2 diabetes mellitus without complications: Secondary | ICD-10-CM

## 2013-04-21 DIAGNOSIS — R609 Edema, unspecified: Secondary | ICD-10-CM

## 2013-04-21 DIAGNOSIS — R05 Cough: Secondary | ICD-10-CM

## 2013-04-21 DIAGNOSIS — R0602 Shortness of breath: Secondary | ICD-10-CM

## 2013-04-21 DIAGNOSIS — J189 Pneumonia, unspecified organism: Secondary | ICD-10-CM

## 2013-04-21 LAB — CBC WITH DIFFERENTIAL/PLATELET
Eosinophils Absolute: 0.2 10*3/uL (ref 0.0–0.7)
Eosinophils Relative: 3 % (ref 0–5)
Lymphs Abs: 1.3 10*3/uL (ref 0.7–4.0)
MCH: 29.6 pg (ref 26.0–34.0)
MCV: 88.8 fL (ref 78.0–100.0)
Monocytes Absolute: 0.6 10*3/uL (ref 0.1–1.0)
Platelets: 280 10*3/uL (ref 150–400)
RBC: 3.65 MIL/uL — ABNORMAL LOW (ref 4.22–5.81)
RDW: 13.6 % (ref 11.5–15.5)

## 2013-04-21 LAB — COMPLETE METABOLIC PANEL WITH GFR
ALT: 61 U/L — ABNORMAL HIGH (ref 0–53)
BUN: 24 mg/dL — ABNORMAL HIGH (ref 6–23)
CO2: 26 mEq/L (ref 19–32)
Creat: 0.89 mg/dL (ref 0.50–1.35)
GFR, Est African American: >89 mL/min
GFR, Est Non African American: >89 mL/min
Total Bilirubin: 0.6 mg/dL (ref 0.3–1.2)

## 2013-04-21 LAB — POCT GLYCOSYLATED HEMOGLOBIN (HGB A1C): Hemoglobin A1C: 8

## 2013-04-21 MED ORDER — FUROSEMIDE 20 MG PO TABS: 20.0000 mg | ORAL_TABLET | Freq: Two times a day (BID) | ORAL | Status: DC

## 2013-04-21 MED ORDER — IPRATROPIUM-ALBUTEROL 0.5-2.5 (3) MG/3ML IN SOLN
3.0000 mL | Freq: Once | RESPIRATORY_TRACT | Status: AC
  Administered 2013-04-21: 3 mL via RESPIRATORY_TRACT

## 2013-04-21 MED ORDER — IPRATROPIUM-ALBUTEROL 0.5-2.5 (3) MG/3ML IN SOLN: 3.0000 mL | Freq: Four times a day (QID) | RESPIRATORY_TRACT | Status: AC | PRN

## 2013-04-21 MED ORDER — CEFTRIAXONE SODIUM 1 G IJ SOLR
1.0000 g | Freq: Once | INTRAMUSCULAR | Status: AC
  Administered 2013-04-21: 1 g via INTRAMUSCULAR

## 2013-04-21 MED ORDER — MOXIFLOXACIN HCL 400 MG PO TABS: 400.0000 mg | ORAL_TABLET | Freq: Every day | ORAL | Status: DC

## 2013-04-21 NOTE — Progress Notes (Signed)
CC: Carl Palmer is a 62 y.o. male is here for Shortness of Breath and Diabetes   Subjective: HPI:  Patient complains of shortness of breath left lateral mid back pain which is nonradiating along with fatigue all of which have been worsening since starting azithromycin last week. Symptoms are currently moderate in severity. Shortness of breath is worse with lying down or exertion. Nothing makes back pain better or worse nothing makes fatigue better or worse. His cough from 2 weeks ago has not improved, now described as productive without blood present 24 hours a day. Slightly improved with albuterol inhaler. All the above symptoms were slightly improved last night when he used his wife's oxygen machine. He reports chills but denies fevers or sweats. He denies exertional chest pain irregular heartbeat, headache, confusion, wheezing, nasal congestion, facial pain, diarrhea, abdominal pain nor rashes. He does endorse bilateral lower extremity edema which was noticed for the first time on his way here to clinic. He denies history of blood clots.  Oxygen saturation at rest on room air 87%, oxygen saturation walking on room air 88%, 2 L of oxygen via nasal cannula was required for oxygen saturation to raise above 88% with walking and while at rest.  Review Of Systems Outlined In HPI  Past Medical History  Diagnosis Date  . History of myocardial infarction 10/17/2012  . Elevated blood pressure 10/17/2012     Family History  Problem Relation Age of Onset  . Hypertension Father      History  Substance Use Topics  . Smoking status: Smoker, Current Status Unknown  . Smokeless tobacco: Not on file  . Alcohol Use: Not on file     Objective: Filed Vitals:   04/21/13 0915  BP: 123/75  Pulse: 101  Temp: 98.1 F (36.7 C)    General: Alert and Oriented, No Acute Distress but does appear mildly fatigued HEENT: Pupils equal, round, reactive to light. Conjunctivae clear.  External ears  unremarkable, canals clear with intact TMs with appropriate landmarks.  Middle ear appears open without effusion. Pink inferior turbinates.  Moist mucous membranes, pharynx without inflammation nor lesions.  Neck supple without palpable lymphadenopathy nor abnormal masses. Lungs: Distant breath sounds without wheezing nor rales there is questionable rhonchi in the left lower lung lobe Cardiac: Mild tachycardia with regular rhythm, Normal S1/S2.  No murmurs, rubs, nor gallops.   Abdomen: Soft nontender palpation Extremities: 1+ pitting edema symmetric bilaterally affecting the ankles distal shins.  Strong peripheral pulses.  Mental Status: No depression, anxiety, nor agitation. Skin: Warm and dry.  Assessment & Plan: Carl Palmer was seen today for shortness of breath and diabetes.  Diagnoses and associated orders for this visit:  CAP (community acquired pneumonia) - Ambulatory referral to Home Health - moxifloxacin (AVELOX) 400 MG tablet; Take 1 tablet (400 mg total) by mouth daily. - cefTRIAXone (ROCEPHIN) injection 1 g; Inject 1 g into the muscle once. - ipratropium-albuterol (DUONEB) 0.5-2.5 (3) MG/3ML nebulizer solution 3 mL; Take 3 mL by nebulization once.  Type 2 diabetes mellitus - POCT HgB A1C  SOB (shortness of breath) - Cancel: CBC with Differential - Cancel: COMPLETE METABOLIC PANEL WITH GFR - Cancel: B Nat Peptide - DG Chest 2 View; Future - B Nat Peptide - CBC w/Diff - COMPLETE METABOLIC PANEL WITH GFR - Ambulatory referral to Home Health - ipratropium-albuterol (DUONEB) 0.5-2.5 (3) MG/3ML nebulizer solution 3 mL; Take 3 mL by nebulization once.  Edema - Cancel: CBC with Differential - Cancel: COMPLETE METABOLIC PANEL  WITH GFR - Cancel: B Nat Peptide - B Nat Peptide - CBC w/Diff - COMPLETE METABOLIC PANEL WITH GFR  Cough - DG Chest 2 View; Future - B Nat Peptide - CBC w/Diff - COMPLETE METABOLIC PANEL WITH GFR - Ambulatory referral to Home  Health - ipratropium-albuterol (DUONEB) 0.5-2.5 (3) MG/3ML nebulizer solution 3 mL; Take 3 mL by nebulization once.     Discussed with patient and wife that time concerned his hypoxia is due to pneumonia and/or acute CHF exacerbation. Chest x-ray with clinical correlation would suggest pneumonia therefore start Avelox, he does not believe that it would fill this in the next 24 hours due to financial concerns therefore 1 g Rocephin was given IM but he was strongly encouraged to start Avelox as soon as possible. He was given a DuoNeb in the room and reports mild improvement with cough and shortness of breath. We will arrange home health for 2 L of oxygen around the clock along with a nebulizer to use as needed. I strongly encouraged him to stay out of work for the next 48 hours with a recheck of oxygen level on room air before returning to work. Stat labs above to look into it anemia versus CHF  40 minutes spent face-to-face during visit today of which at least 50% was counseling or coordinating care regarding hypoxemia, community acquired pneumonia, edema, cough   Return in about 3 days (around 04/24/2013).

## 2013-04-21 NOTE — Addendum Note (Signed)
Addended by: Laren Boom on: 04/21/2013 03:13 PM   Modules accepted: Orders

## 2013-04-21 NOTE — Telephone Encounter (Signed)
I called and spoke with pt's wife that per Dr. Ivan Anchors pt has mild heart failure which is contributing to the swelling in the legs and sob. I let her know that Dr. Ivan Anchors sent over lasix which is a fluid pill to [facility] and this should help with the swelling in his legs. I transferred the wife up front so she could schedule a f/u appt for pt for no later than Friday of this week.

## 2013-04-21 NOTE — Progress Notes (Signed)
Labs reviewed elevated BNP concerning for acute congestive heart failure he does have some cardiomegaly on his chest x-ray, will send in a short course of Lasix to help with diuresis.

## 2013-04-23 ENCOUNTER — Encounter: Payer: Self-pay | Admitting: Family Medicine

## 2013-04-23 ENCOUNTER — Ambulatory Visit (INDEPENDENT_AMBULATORY_CARE_PROVIDER_SITE_OTHER): Payer: Commercial Managed Care - PPO | Admitting: Family Medicine

## 2013-04-23 DIAGNOSIS — J189 Pneumonia, unspecified organism: Secondary | ICD-10-CM

## 2013-04-23 MED ORDER — CEFTRIAXONE SODIUM 1 G IJ SOLR
1.0000 g | Freq: Once | INTRAMUSCULAR | Status: AC
  Administered 2013-04-23: 1 g via INTRAMUSCULAR

## 2013-04-23 NOTE — Progress Notes (Signed)
  Subjective:    Patient ID: Carl Palmer, male    DOB: 1951/07/19, 62 y.o.   MRN: [id number]  HPI   Here for 2nd rocephin injection  Review of Systems     Objective:   Physical Exam        Assessment & Plan:  Given without complication

## 2013-04-23 NOTE — Progress Notes (Signed)
It appears patient was unable to afford Avelox we'll proceed with Rocephin for a total of 7 days consecutive injections for community acquired pneumonia

## 2013-04-24 ENCOUNTER — Ambulatory Visit (INDEPENDENT_AMBULATORY_CARE_PROVIDER_SITE_OTHER): Payer: Commercial Managed Care - PPO | Admitting: Family Medicine

## 2013-04-24 ENCOUNTER — Encounter: Payer: Self-pay | Admitting: Family Medicine

## 2013-04-24 VITALS — BP 123/70 | HR 103 | Temp 98.4°F | Wt 201.0 lb

## 2013-04-24 DIAGNOSIS — I509 Heart failure, unspecified: Secondary | ICD-10-CM

## 2013-04-24 DIAGNOSIS — R799 Abnormal finding of blood chemistry, unspecified: Secondary | ICD-10-CM

## 2013-04-24 DIAGNOSIS — E119 Type 2 diabetes mellitus without complications: Secondary | ICD-10-CM

## 2013-04-24 DIAGNOSIS — J189 Pneumonia, unspecified organism: Secondary | ICD-10-CM

## 2013-04-24 DIAGNOSIS — R7989 Other specified abnormal findings of blood chemistry: Secondary | ICD-10-CM

## 2013-04-24 LAB — GLUCOSE, POCT (MANUAL RESULT ENTRY): POC Glucose: 80 mg/dl (ref 70–99)

## 2013-04-24 MED ORDER — CEFTRIAXONE SODIUM 1 G IJ SOLR
1.0000 g | INTRAMUSCULAR | Status: DC
  Administered 2013-04-24: 1 g via INTRAMUSCULAR

## 2013-04-24 NOTE — Progress Notes (Signed)
CC: Carl Palmer is a 62 y.o. male is here for f/u pneumonia   Subjective: HPI:  Followup community acquired pneumonia: He is getting Rocephin on a daily basis 1 g he skipped Wednesday however he reports he is feeling much better although fatigue. His breathing has significantly improved he shortness of breath with exertion but denies at rest. He would like to go back to work. He still has mild swelling subjectively in his feet he has not started Lasix yet. Denies orthopnea fevers, chills, chest pain or irregular heartbeat. Continues to have a cough however severity is subjectively much better now still productive no blood in sputum  Followup type 2 diabetes: A1c last visit 8.0 he continues on glipizide and metformin max doses. He does report some worsening vision blurry. Blood sugar at home on average 160 randomly no fasting blood sugars. Denies hypoglycemic episodes. Denies poorly healing wounds nor motor or sensory disturbances   Review Of Systems Outlined In HPI  Past Medical History  Diagnosis Date  . History of myocardial infarction 10/17/2012  . Elevated blood pressure 10/17/2012     Family History  Problem Relation Age of Onset  . Hypertension Father      History  Substance Use Topics  . Smoking status: Current Every Day Smoker  . Smokeless tobacco: Not on file  . Alcohol Use: Not on file     Objective: Filed Vitals:   04/24/13 1116  BP:   Pulse:   Temp: 98.4 F (36.9 C)    General: Alert and Oriented, No Acute Distress HEENT: Pupils equal, round, reactive to light. Conjunctivae clear.  Moist mucous membranes pharynx unremarkable Lungs: Clear to auscultation bilaterally, no wheezing/ronchi/rales.  Comfortable work of breathing. Good air movement. Cardiac: Regular rate and rhythm. Normal S1/S2.  No murmurs, rubs, nor gallops.   Abdomen: Normal bowel sounds, soft and non tender without palpable masses. Extremities: 1+ pitting edema of the ankles symmetric  bilaterally  Strong peripheral pulses.  Mental Status: No depression, anxiety, nor agitation. Skin: Warm and dry.  Assessment & Plan: Carl Palmer was seen today for f/u pneumonia.  Diagnoses and associated orders for this visit:  CHF (congestive heart failure)  Elevated brain natriuretic peptide (BNP) level  Type 2 diabetes mellitus    Community acquired pneumonia: Improving he is unable to afford Avelox therefore will come in for Rocephin 1 g daily basis stopping on Tuesday. CHF: I've encouraged him to have a echocardiogram, after discussing benefits of this he politely declines, I've encouraged to start his 4 days of Lasix Type 2 diabetes: Uncontrolled,  I've encouraged him to switch to Janumet However he would prefer to wait until he's feeling better and he'll return at that time.   Return in about 1 day (around 04/25/2013).

## 2013-04-25 ENCOUNTER — Emergency Department (INDEPENDENT_AMBULATORY_CARE_PROVIDER_SITE_OTHER)
Admission: EM | Admit: 2013-04-25 | Discharge: 2013-04-25 | Disposition: A | Discharge status: Home / Self Care | Payer: Commercial Managed Care - PPO | Source: Home / Self Care | Attending: Family Medicine | Admitting: Family Medicine

## 2013-04-25 DIAGNOSIS — J189 Pneumonia, unspecified organism: Secondary | ICD-10-CM

## 2013-04-25 MED ORDER — CEFTRIAXONE SODIUM 1 G IJ SOLR
1.0000 g | Freq: Once | INTRAMUSCULAR | Status: AC
  Administered 2013-04-25: 1 g via INTRAMUSCULAR

## 2013-04-25 NOTE — ED Notes (Addendum)
Here for Rocephin injection for PNA, Temp: 97.9

## 2013-04-26 ENCOUNTER — Encounter: Payer: Self-pay | Admitting: *Deleted

## 2013-04-26 ENCOUNTER — Emergency Department (INDEPENDENT_AMBULATORY_CARE_PROVIDER_SITE_OTHER)
Admission: EM | Admit: 2013-04-26 | Discharge: 2013-04-26 | Disposition: A | Discharge status: Home / Self Care | Payer: Commercial Managed Care - PPO | Source: Home / Self Care

## 2013-04-26 DIAGNOSIS — J189 Pneumonia, unspecified organism: Secondary | ICD-10-CM

## 2013-04-26 MED ORDER — CEFTRIAXONE SODIUM 1 G IJ SOLR
1.0000 g | Freq: Once | INTRAMUSCULAR | Status: AC
  Administered 2013-04-26: 1 g via INTRAMUSCULAR

## 2013-04-26 NOTE — ED Notes (Signed)
Patient here for Rocephin shot 1 gr per Dr. Ivan Anchors. Shot administered without any difficulty.

## 2013-04-27 ENCOUNTER — Ambulatory Visit: Payer: Commercial Managed Care - PPO | Admitting: *Deleted

## 2013-04-27 ENCOUNTER — Ambulatory Visit (INDEPENDENT_AMBULATORY_CARE_PROVIDER_SITE_OTHER): Payer: Commercial Managed Care - PPO | Admitting: Family Medicine

## 2013-04-27 VITALS — BP 96/60 | HR 103

## 2013-04-27 DIAGNOSIS — J189 Pneumonia, unspecified organism: Secondary | ICD-10-CM

## 2013-04-27 MED ORDER — CEFTRIAXONE SODIUM 1 G IJ SOLR
1.0000 g | INTRAMUSCULAR | Status: DC
  Administered 2013-04-27: 1 g via INTRAMUSCULAR

## 2013-04-27 NOTE — Progress Notes (Signed)
I was present for all necessary aspects of today's encounter. 

## 2013-04-27 NOTE — Progress Notes (Signed)
  Subjective:    Patient ID: Carl Palmer, male    DOB: 10-04-1950, 62 y.o.   MRN: [id number] Pt here for rocephin injection.  Given IM with no complications.  Donne Anon, CMA HPI    Review of Systems     Objective:   Physical Exam        Assessment & Plan:

## 2013-04-28 ENCOUNTER — Encounter: Payer: Self-pay | Admitting: Family Medicine

## 2013-04-28 ENCOUNTER — Telehealth: Payer: Self-pay | Admitting: *Deleted

## 2013-04-28 ENCOUNTER — Ambulatory Visit (INDEPENDENT_AMBULATORY_CARE_PROVIDER_SITE_OTHER): Payer: Commercial Managed Care - PPO | Admitting: Family Medicine

## 2013-04-28 ENCOUNTER — Ambulatory Visit: Payer: Commercial Managed Care - PPO | Admitting: *Deleted

## 2013-04-28 VITALS — BP 109/70 | HR 110

## 2013-04-28 DIAGNOSIS — J189 Pneumonia, unspecified organism: Secondary | ICD-10-CM

## 2013-04-28 DIAGNOSIS — K219 Gastro-esophageal reflux disease without esophagitis: Secondary | ICD-10-CM

## 2013-04-28 MED ORDER — DEXLANSOPRAZOLE 60 MG PO CPDR: 60.0000 mg | DELAYED_RELEASE_CAPSULE | Freq: Every day | ORAL | Status: DC

## 2013-04-28 MED ORDER — CEFTRIAXONE SODIUM 1 G IJ SOLR
1.0000 g | Freq: Once | INTRAMUSCULAR | Status: AC
  Administered 2013-04-28: 1 g via INTRAMUSCULAR

## 2013-04-28 NOTE — Telephone Encounter (Signed)
Trial of dexilant placed in Andrea's inbox, would still encourage pt to f/u with me within the next week.

## 2013-04-28 NOTE — Progress Notes (Signed)
I was present for all necessary aspects of today's visit 

## 2013-04-28 NOTE — Telephone Encounter (Signed)
Pt's wife notified.

## 2013-04-28 NOTE — Telephone Encounter (Signed)
In the office today pt states he has had burning n his chest that wakes him up at night. Pt states  the feeling is coming from acid reflux. He states he has mentioned this to Dr. Ivan Anchors in the past and he wanted to get samples if we had anything. I did check to see if we had any samples and we didn't not. I did tell pt that he may want to f/u with Dr. Ivan Anchors to discuss this with him since I wasn't completley sure that his sxs were related to acid reflux being that he has recent dx of pneumonia. Pt states he was having these sxs before the pneumonia.Pt denies fever or any worsening SOB He wanted me to pass this info along to Dr. Ivan Anchors

## 2013-04-29 ENCOUNTER — Ambulatory Visit (INDEPENDENT_AMBULATORY_CARE_PROVIDER_SITE_OTHER): Payer: Commercial Managed Care - PPO | Admitting: Family Medicine

## 2013-04-29 ENCOUNTER — Telehealth: Payer: Self-pay | Admitting: *Deleted

## 2013-04-29 VITALS — BP 113/71 | HR 101

## 2013-04-29 DIAGNOSIS — R609 Edema, unspecified: Secondary | ICD-10-CM

## 2013-04-29 DIAGNOSIS — J189 Pneumonia, unspecified organism: Secondary | ICD-10-CM

## 2013-04-29 MED ORDER — FUROSEMIDE 20 MG PO TABS: 20.0000 mg | ORAL_TABLET | Freq: Two times a day (BID) | ORAL | Status: DC

## 2013-04-29 MED ORDER — CEFTRIAXONE SODIUM 1 G IJ SOLR
1.0000 g | Freq: Once | INTRAMUSCULAR | Status: AC
  Administered 2013-04-29: 1 g via INTRAMUSCULAR

## 2013-04-29 NOTE — Telephone Encounter (Signed)
Pt was in the office today and states his feet and legs are still swollen. He does not have any more lasix. I did tell pt again (see previous phone note) that he should f/u with Dr. Ivan Anchors sometimes next week. Please advise if there is anything else you want him to do in the meantime

## 2013-04-29 NOTE — Telephone Encounter (Signed)
10 day supply [facility] to last until f/u appt.

## 2013-04-29 NOTE — Progress Notes (Signed)
I was present for all necessary aspects of today's encounter. 

## 2013-04-29 NOTE — Telephone Encounter (Signed)
informed patient as directed, about laxis being called in. Patient stated he will schedule a follow up appt. Josefita Weissmann,CMA

## 2013-04-29 NOTE — Progress Notes (Signed)
  Subjective:    Patient ID: Carl Palmer, male    DOB: 03-22-1951, 61 y.o.   MRN: [id number]  HPI   Here to complete course of rocephin Review of Systems     Objective:   Physical Exam        Assessment & Plan:  Given without complication

## 2013-05-15 ENCOUNTER — Telehealth: Payer: Self-pay | Admitting: *Deleted

## 2013-05-15 NOTE — Telephone Encounter (Signed)
Pt called and states his legs are still swelling. Advised to make an appt

## 2013-05-18 ENCOUNTER — Ambulatory Visit (INDEPENDENT_AMBULATORY_CARE_PROVIDER_SITE_OTHER): Payer: Commercial Managed Care - PPO | Admitting: Family Medicine

## 2013-05-18 ENCOUNTER — Encounter: Payer: Self-pay | Admitting: Family Medicine

## 2013-05-18 VITALS — BP 119/81 | HR 107 | Wt 200.0 lb

## 2013-05-18 DIAGNOSIS — R609 Edema, unspecified: Secondary | ICD-10-CM

## 2013-05-18 DIAGNOSIS — R0602 Shortness of breath: Secondary | ICD-10-CM

## 2013-05-18 DIAGNOSIS — J309 Allergic rhinitis, unspecified: Secondary | ICD-10-CM

## 2013-05-18 MED ORDER — FUROSEMIDE 40 MG PO TABS: 40.0000 mg | ORAL_TABLET | Freq: Two times a day (BID) | ORAL | Status: DC

## 2013-05-18 MED ORDER — BECLOMETHASONE DIPROPIONATE 80 MCG/ACT NA AERS: 2.0000 | INHALATION_SPRAY | Freq: Every day | NASAL | Status: DC

## 2013-05-18 NOTE — Progress Notes (Signed)
CC: Carl Palmer is a 62 y.o. male is here for Leg Swelling and wants glucose checked   Subjective: HPI:  Patient reports continued swelling in bilateral lower extremities involving the feet and ankles. Symptoms are moderate in severity present on a daily basis. Improves when wearing tight boots, fairly improved with Lasix 20 mg twice a day. Nothing particularly makes better or worse. This has been present for 2-3 months. Overall shortness of breath improved since treating pneumonia earlier this month he still uses oxygen most nights of the week when sleeping or he'll feel short of breath when lying down flat. He endorses abdominal bloating mild in severity present on a daily basis. Denies chest pain, irregular heartbeat, limb claudication, increased sodium intake, rashes nor motor or sensory disturbances  Complains of nasal congestion for 2 weeks clear discharge mild severity present a daily basis nothing particularly makes better or worse. Denies fevers, chills, cough, wheezing   Review Of Systems Outlined In HPI  Past Medical History  Diagnosis Date  . History of myocardial infarction 10/17/2012  . Elevated blood pressure 10/17/2012     Family History  Problem Relation Age of Onset  . Hypertension Father      History  Substance Use Topics  . Smoking status: Current Every Day Smoker  . Smokeless tobacco: Not on file  . Alcohol Use: Not on file     Objective: Filed Vitals:   05/18/13 0905  BP: 119/81  Pulse: 107    General: Alert and Oriented, No Acute Distress HEENT: Pupils equal, round, reactive to light. Conjunctivae clear. Moist mucous membranes pharynx unremarkable Lungs: Clear to auscultation bilaterally, no wheezing/ronchi/rales.  Comfortable work of breathing. Good air movement. Cardiac: Regular rate and rhythm. Normal S1/S2.  No murmurs, rubs, nor gallops.   Abdomen: Normal bowel sounds, soft and non tender without palpable masses. Extremities: 1+ pitting edema  from the shins distally symmetrical.  Strong peripheral pulses.  Mental Status: No depression, anxiety, nor agitation. Skin: Warm and dry. No gross skin abnormalities in the extremities  Assessment & Plan: Carl Palmer was seen today for leg swelling and wants glucose checked.  Diagnoses and associated orders for this visit:  Edema - furosemide (LASIX) 40 MG tablet; Take 1 tablet (40 mg total) by mouth 2 (two) times daily. To last until follow up visit. - 2D Echocardiogram without contrast; Future  Shortness of breath - 2D Echocardiogram without contrast; Future  Allergic rhinitis    Edema with shortness of breath: There is concern for CHF he will continue on lisinopril I have asked him to get an echocardiogram a findings suspicious for CHF will start beta blocker, start increased dose as needed Lasix now.  Allergic rhinitis: Sample of qnasl provided  Return in about 4 weeks (around 06/15/2013).

## 2013-05-22 ENCOUNTER — Encounter: Payer: Self-pay | Admitting: Family Medicine

## 2013-05-22 ENCOUNTER — Ambulatory Visit (INDEPENDENT_AMBULATORY_CARE_PROVIDER_SITE_OTHER): Payer: Commercial Managed Care - PPO | Admitting: Family Medicine

## 2013-05-22 VITALS — BP 125/75 | HR 111 | Wt 205.0 lb

## 2013-05-22 DIAGNOSIS — R609 Edema, unspecified: Secondary | ICD-10-CM

## 2013-05-22 DIAGNOSIS — I509 Heart failure, unspecified: Secondary | ICD-10-CM

## 2013-05-22 MED ORDER — METOLAZONE 5 MG PO TABS: 5.0000 mg | ORAL_TABLET | Freq: Every day | ORAL | Status: DC

## 2013-05-22 NOTE — Patient Instructions (Addendum)
Take furosemide (Lasix) 40mg  tablet once you get home, then take another one sometime tonight around 11pm.   Starting tomorrow take furosemide 40mg  tablet only twice a day.   If you notice swelling or shortness of breath is worsening or not improving any time over the weekend you can take a single dose of metolazone (Zaroxolyn) 5mg  along with a furosemide (Lasix) dose to amplify the effect of furosemide (Lasix).  Do not take more than a single dose of metolazone (Zaroxolyn) on a daily basis and only take it if you absolutely need it.    For the next three days limit your fluid intake to less than 2 Liters a day, limit sodium intake to less than 1500mg  a day.

## 2013-05-22 NOTE — Progress Notes (Signed)
CC: Carl Palmer is a 62 y.o. male is here for Edema and Shortness of Breath   Subjective: HPI:  Patient complains of worsening lower extremity edema moderate to severe in severity noticed this morning that has worsened from his baseline mild to moderate severity since I saw him last earlier this week. He was unable to get Lasix until this morning he took a single dose of 40 mg noticed drastic diuretic effect however no appreciable change in swelling. He's had worsening shortness of breath with exertion and at rest and is back to wearing oxygen as this keeps him subjectively feeling better at 2 L per minute. He denies change in diet or fluid intake. Denies fevers, chills, nausea, vomiting, nor back pain. Endorses abdominal bloating without pain. Denies chest pain or motor sensory disturbances.  He wants to quit smoking.  Review Of Systems Outlined In HPI  Past Medical History  Diagnosis Date  . History of myocardial infarction 10/17/2012  . Elevated blood pressure 10/17/2012     Family History  Problem Relation Age of Onset  . Hypertension Father      History  Substance Use Topics  . Smoking status: Current Every Day Smoker  . Smokeless tobacco: Not on file  . Alcohol Use: Not on file     Objective: Filed Vitals:   05/22/13 1555  BP: 125/75  Pulse: 111    General: Alert and Oriented, No Acute Distress HEENT: Pupils equal, round, reactive to light. Conjunctivae clear.  Moist mucous membranes pharynx unremarkable Lungs: Comfortable work of breathing, good air movement, rales in the dependent posterior lung fields without rhonchi nor wheezing. No signs of consolidation  Cardiac: Mild tachycardia with regular rhythm. Normal S1/S2.  No murmurs, rubs, nor gallops.   Abdomen: Soft nontender Extremities: 1+ pitting edema symmetric from the midfoot to the shins bilaterally.  Strong peripheral pulses.  Mental Status: No depression, anxiety, nor agitation. Skin: Warm and  dry.  Assessment & Plan: Nashid was seen today for edema and shortness of breath.  Diagnoses and associated orders for this visit:  CHF (congestive heart failure) - metolazone (ZAROXOLYN) 5 MG tablet; Take 1 tablet (5 mg total) by mouth daily.    EKG was obtained to further evaluate clinical suspicion of congestive heart failure.  Significant for borderline left ventricular hypertrophy and poor R wave progression. Sinus tachycardia normal axis normal intervals Q wave in lead 3 no ST segment elevation or depression normal T-wave morphology.  I believe he is suffering from acute on chronic congestive heart failure, outpatient diuresis involving Lasix 3 doses today of 40 mg, starting tomorrow go back to 40 mg twice a day discussed using Zaroxolyn if lack of improvement, return on Monday.Signs and symptoms requring emergent/urgent reevaluation were discussed with the patient. Provided with vapor nicotine to help with smoking cessation  Return in about 3 days (around 05/25/2013).

## 2013-05-25 ENCOUNTER — Encounter: Payer: Self-pay | Admitting: *Deleted

## 2013-05-27 ENCOUNTER — Inpatient Hospital Stay (HOSPITAL_BASED_OUTPATIENT_CLINIC_OR_DEPARTMENT_OTHER): Admission: RE | Admit: 2013-05-27 | Payer: Commercial Managed Care - PPO | Source: Ambulatory Visit

## 2013-06-03 ENCOUNTER — Ambulatory Visit (HOSPITAL_BASED_OUTPATIENT_CLINIC_OR_DEPARTMENT_OTHER): Payer: Commercial Managed Care - PPO

## 2013-06-15 ENCOUNTER — Encounter: Payer: Self-pay | Admitting: Family Medicine

## 2013-06-15 DIAGNOSIS — D649 Anemia, unspecified: Secondary | ICD-10-CM | POA: Insufficient documentation

## 2013-06-17 ENCOUNTER — Ambulatory Visit (INDEPENDENT_AMBULATORY_CARE_PROVIDER_SITE_OTHER): Payer: Commercial Managed Care - PPO | Admitting: Family Medicine

## 2013-06-17 ENCOUNTER — Encounter: Payer: Self-pay | Admitting: Family Medicine

## 2013-06-17 VITALS — BP 123/80 | HR 114 | Temp 98.2°F | Wt 195.0 lb

## 2013-06-17 DIAGNOSIS — N179 Acute kidney failure, unspecified: Secondary | ICD-10-CM

## 2013-06-17 DIAGNOSIS — J189 Pneumonia, unspecified organism: Secondary | ICD-10-CM

## 2013-06-17 DIAGNOSIS — R609 Edema, unspecified: Secondary | ICD-10-CM

## 2013-06-17 DIAGNOSIS — I5020 Unspecified systolic (congestive) heart failure: Secondary | ICD-10-CM

## 2013-06-17 DIAGNOSIS — I509 Heart failure, unspecified: Secondary | ICD-10-CM

## 2013-06-17 LAB — BASIC METABOLIC PANEL WITH GFR
CO2: 28 mEq/L (ref 19–32)
Chloride: 102 mEq/L (ref 96–112)
GFR, Est Non African American: >89 mL/min
Potassium: 4.9 mEq/L (ref 3.5–5.3)
Sodium: 138 mEq/L (ref 135–145)

## 2013-06-17 MED ORDER — FUROSEMIDE 40 MG PO TABS: 40.0000 mg | ORAL_TABLET | Freq: Two times a day (BID) | ORAL | Status: DC

## 2013-06-17 MED ORDER — CARVEDILOL 3.125 MG PO TABS: 3.1250 mg | ORAL_TABLET | Freq: Two times a day (BID) | ORAL | Status: DC

## 2013-06-17 MED ORDER — LEVOFLOXACIN 750 MG PO TABS: 750.0000 mg | ORAL_TABLET | Freq: Every day | ORAL | Status: AC

## 2013-06-17 MED ORDER — PREDNISONE 20 MG PO TABS: ORAL_TABLET | ORAL | Status: AC

## 2013-06-17 NOTE — Progress Notes (Signed)
CC: Carl Palmer is a 62 y.o. male is here for hospital f/u   Subjective: HPI:  Hospital followup October 10-12 for COPD exacerbation in the setting of mucoid pneumonia, acute kidney injury, systolic congestive heart failure.  CAP with suspected COPD exacerbation:  Patient presented to local emergency room after 3 days of worsening fevers, shortness of breath, wheezing. He was found to have an infiltrate on chest x-ray appears he was started on levofloxacin, it appears he received 3 days of this however he stopped taking it on the 13th as he was unsure whether or not he should really get it filled as an outpatient. Has a inpatient he reports his shortness of breath and cough was moderately improved however he still reports mild to moderate symptoms are present on a daily basis but had not been getting better or worsens discharge. He has been using oxygen he sleeps because it helps him feel more comfortable. He has been receiving Combivent in the hospital however has not been using it as an outpatient as he is unsure whether or not he should be using it. He does have this on person. He was given prednisone in the hospital an unknown dose but discharged on 40 mg again he has not refilled this or continued. States cough is productive without blood and sputum.  Acute kidney injury: Discharge his creatinine was 1.3 relative to 0.9 as an outpatient. Metformin was held in the hospital however he has returned to taking metformin since he returned home.  Systolic congestive heart failure: He was unable to have a transthoracic echocardiogram prior to admission which was ordered at his last visit. He did receive this in the hospital which was significant for his left ventricle ejection fraction of 30-35% with no comparisons. He's been referred to Dr. Leeann Must cardiology, he is unsure whether or not he wants to go to this visit. He was prescribed carvedilol in addition to his already taking lisinopril however he  has not started carvedilol yet.  He had an anemia of 9-10 in the hospital fecal occult was positive, inpatient team had recommended colonoscopy understandably however postponing until after cardiac condition has been stabilized.  Patient's only complaint today is increasing swelling in bilateral lower extremities along with continued cough, and a tremor.  He currently denies fevers, chills, nausea, abdominal pain, diarrhea, constipation, confusion, wheezing, chest pain, rashes, motor or sensory disturbances  Review Of Systems Outlined In HPI  Past Medical History  Diagnosis Date  . History of myocardial infarction 10/17/2012  . Elevated blood pressure 10/17/2012     Family History  Problem Relation Age of Onset  . Hypertension Father      History  Substance Use Topics  . Smoking status: Current Every Day Smoker  . Smokeless tobacco: Not on file  . Alcohol Use: Not on file     Objective: Filed Vitals:   06/17/13 0955  BP: 123/80  Pulse: 114  Temp: 98.2 F (36.8 C)    General: Alert and Oriented, No Acute Distress HEENT: Pupils equal, round, reactive to light. Conjunctivae clear.  External ears unremarkable, canals clear with intact TMs with appropriate landmarks.  Middle ear appears open without effusion. Moist mucous no brings pharynx unremarkable Lungs: Clear to auscultation bilaterally, no wheezing/ronchi/rales.  Comfortable work of breathing. Good air movement. Occasional coughing Cardiac: Regular rate and rhythm. Normal S1/S2.  No murmurs, rubs, nor gallops.   Abdomen: Soft nontender Extremities: 1+ pedal pitting edema bilaterally symmetric.  Strong peripheral pulses.  Mental  Status: No depression, anxiety, nor agitation. Skin: Warm and dry.  Assessment & Plan: Carl Palmer was seen today for hospital f/u.  Diagnoses and associated orders for this visit:  Systolic CHF - carvedilol (COREG) 3.125 MG tablet; Take 1 tablet (3.125 mg total) by mouth 2 (two) times  daily.  Acute renal failure - BASIC METABOLIC PANEL WITH GFR  CAP (community acquired pneumonia) - levofloxacin (LEVAQUIN) 750 MG tablet; Take 1 tablet (750 mg total) by mouth daily. - predniSONE (DELTASONE) 20 MG tablet; Three tabs at once daily for five days.  Edema - furosemide (LASIX) 40 MG tablet; Take 1 tablet (40 mg total) by mouth 2 (two) times daily. To last until follow up visit.  CHF (congestive heart failure)    Acute on chronic systolic heart failure with edema. : Improved, I strongly encouraged to follow up with Dr. Leeann Must and establish care. I've counseled him to start his new prescription of carvedilol, continue lisinopril, and to use Lasix at home to help with his pedal edema and shortness of breath no more than 40 mg twice a day. He still appears volume overloaded a mild degree.. Acute renal failure: Due for repeat creatinine and GFR today, encouraged to hold metformin until we get this result back. Community acquired pneumonia: Strongly encouraged patient to start levofloxacin as soon as possible we'll restart a full treatment regimen since he has been off of this for 2 days. COPD exacerbation: Strongly encouraged to restart prednisone and to use Combivent 4 times a day as needed. Return once feeling significantly better for formal spirometry.   Return in about 2 weeks (around 07/01/2013).

## 2013-06-18 ENCOUNTER — Encounter: Payer: Self-pay | Admitting: *Deleted

## 2013-06-26 ENCOUNTER — Other Ambulatory Visit: Payer: Self-pay | Admitting: Family Medicine

## 2013-07-08 ENCOUNTER — Ambulatory Visit (INDEPENDENT_AMBULATORY_CARE_PROVIDER_SITE_OTHER): Payer: Commercial Managed Care - PPO | Admitting: Family Medicine

## 2013-07-08 ENCOUNTER — Encounter: Payer: Self-pay | Admitting: Family Medicine

## 2013-07-08 VITALS — BP 111/74 | HR 116 | Temp 98.9°F | Wt 198.0 lb

## 2013-07-08 DIAGNOSIS — R0602 Shortness of breath: Secondary | ICD-10-CM

## 2013-07-08 DIAGNOSIS — I509 Heart failure, unspecified: Secondary | ICD-10-CM

## 2013-07-08 MED ORDER — METOLAZONE 5 MG PO TABS: 5.0000 mg | ORAL_TABLET | Freq: Every day | ORAL | Status: DC

## 2013-07-08 NOTE — Progress Notes (Signed)
CC: Carl Palmer is a 62 y.o. male is here for Emesis   Subjective: HPI:  Complains of 3 days of worsening lower extremity edema along with fatigue, occasional shortness of breath and low blood sugars. Blood sugar has been low as 30 this morning improved after eating granulated sugar. The lowest it has been other than above has been 113 is fluctuating between the air and 180. All this over the last 3 days. Symptoms above seem to be getting worse on a daily basis. He continues to take glipizide and metformin full dose once a day but has had decreased oral intake due to vomiting and abdominal bloating. He denies any abdominal pain and vomiting as described as clear without blood or coffee ground appearance. He denies any cough, wheezing, but does complain of shortness of breath with normal exertion. He denies any chest pain with exertion or at rest. Swelling is localized in both legs and symmetric, this is been worsening despite taking 80 mg Lasix twice a day. He's recently finished a regimen of Avelox for community acquired pneumonia.  He denies fevers, chills, abdominal pain, dysuria, rashes, diarrhea, constipation, nor motor or sensory disturbances    Review Of Systems Outlined In HPI  Past Medical History  Diagnosis Date  . History of myocardial infarction 10/17/2012  . Elevated blood pressure 10/17/2012     Family History  Problem Relation Age of Onset  . Hypertension Father      History  Substance Use Topics  . Smoking status: Current Every Day Smoker  . Smokeless tobacco: Not on file  . Alcohol Use: Not on file     Objective: Filed Vitals:   07/08/13 1550  BP: 111/74  Pulse: 116  Temp: 98.9 F (37.2 C)    General: Alert and Oriented, No Acute Distress HEENT: Pupils equal, round, reactive to light. Conjunctivae clear.  External ears unremarkable, canals clear with intact TMs with appropriate landmarks.  Middle ear appears open without effusion. Pink inferior turbinates.   Moist mucous membranes, pharynx without inflammation nor lesions.  Neck supple without palpable lymphadenopathy nor abnormal masses. Lungs: Clear to auscultation bilaterally, no wheezing/ronchi/rales.  Comfortable work of breathing. Good air movement. Cardiac: Regular rate and rhythm. Normal S1/S2.  No murmurs, rubs, nor gallops.   Abdomen: Obese mildly distended, normal bowel sounds, no palpable masses no pain with palpation Extremities: 2+ nonpitting edema from the shins distally bilaterally and symmetric without weeping or overlying skin changes Mental Status: No depression, anxiety, nor agitation. Skin: Warm and dry.  Assessment & Plan: Carl Palmer was seen today for emesis.  Diagnoses and associated orders for this visit:  CHF (congestive heart failure) - POCT Glucose (CBG) - metolazone (ZAROXOLYN) 5 MG tablet; Take 1 tablet (5 mg total) by mouth daily. - CBC w/Diff - COMPLETE METABOLIC PANEL WITH GFR - B Nat Peptide  SOB (shortness of breath) - CBC w/Diff - COMPLETE METABOLIC PANEL WITH GFR - B Nat Peptide    Clinically he appears to be having a CHF exacerbation fortunately his oxygen saturation is appropriate right now I like to try diuresis as an outpatient with Zaroxolyn 5 mg daily taken with an 80 mg dose of furosemide and then 6-8 hours later another 80 mg of furosemide.  Checking CBC to rule out anemia, metabolic panel given his vomiting, and BNP to confirm CHF exacerbation. Signs and symptoms requring emergent/urgent reevaluation were discussed with the patient. Suspect his hyperglycemia is due to decreased oral intake therefore cut back on a  hypoglycemics until diet returns to normal   Return in about 2 days (around 07/10/2013).

## 2013-07-08 NOTE — Patient Instructions (Addendum)
Take only one glipizide-metformin daily until you're back to eating your normal diet. For the next three days take a metolazone once a day with one of your furosemide doses. For the next three days take furosemide 40mg , two pills twice a day.

## 2013-07-09 ENCOUNTER — Telehealth: Payer: Self-pay | Admitting: Family Medicine

## 2013-07-09 ENCOUNTER — Other Ambulatory Visit: Payer: Self-pay

## 2013-07-09 LAB — COMPLETE METABOLIC PANEL WITH GFR
Albumin: 3 g/dL — ABNORMAL LOW (ref 3.5–5.2)
Alkaline Phosphatase: 72 U/L (ref 39–117)
BUN: 36 mg/dL — ABNORMAL HIGH (ref 6–23)
CO2: 33 mEq/L — ABNORMAL HIGH (ref 19–32)
GFR, Est African American: 75 mL/min
GFR, Est Non African American: 65 mL/min
Glucose, Bld: 129 mg/dL — ABNORMAL HIGH (ref 70–99)
Potassium: 3.9 mEq/L (ref 3.5–5.3)
Total Bilirubin: 0.7 mg/dL (ref 0.3–1.2)
Total Protein: 5.7 g/dL — ABNORMAL LOW (ref 6.0–8.3)

## 2013-07-09 LAB — CBC WITH DIFFERENTIAL/PLATELET
Basophils Relative: 0 % (ref 0–1)
Eosinophils Absolute: 0 10*3/uL (ref 0.0–0.7)
Eosinophils Relative: 0 % (ref 0–5)
HCT: 29.8 % — ABNORMAL LOW (ref 39.0–52.0)
Hemoglobin: 9.9 g/dL — ABNORMAL LOW (ref 13.0–17.0)
Lymphs Abs: 1.2 10*3/uL (ref 0.7–4.0)
MCH: 27.3 pg (ref 26.0–34.0)
MCHC: 33.2 g/dL (ref 30.0–36.0)
Monocytes Absolute: 1.2 10*3/uL — ABNORMAL HIGH (ref 0.1–1.0)
Monocytes Relative: 12 % (ref 3–12)
RBC: 3.63 MIL/uL — ABNORMAL LOW (ref 4.22–5.81)

## 2013-07-09 LAB — BRAIN NATRIURETIC PEPTIDE: Brain Natriuretic Peptide: 1331.4 pg/mL — ABNORMAL HIGH (ref 0.0–100.0)

## 2013-07-09 NOTE — Telephone Encounter (Signed)
Spoke with a random from colon nurse about patient's low calcium. Was called as an urgent level. It was evidently decreasing from a prior level approximately 2 weeks ago. Patient is being treated for congestive heart failure. Will forward to PCP immediately.Likely will need referall to hospital for IV calcium

## 2013-07-09 NOTE — Telephone Encounter (Signed)
Pt notified and voiced understanding 

## 2013-07-09 NOTE — Telephone Encounter (Signed)
Carl Palmer, Will you please let Carl Palmer know that his blood work yesterday looks like it's going to be more complicated than we expected when it comes to getting his fluid off.  The diuretic pills he's been prescribed are severely dropping his calcium level which is why he's probably having tremors and feeling so bad.  I suspect this is going to require IV calcium supplementation along with IV medications to help get his fluid off.  I'd encourage him to go to any local ED this morning and let them know that his calcium level overnight was 6 and that his PCP thought he may need to be admitted.

## 2013-07-10 ENCOUNTER — Ambulatory Visit: Payer: Commercial Managed Care - PPO | Admitting: Family Medicine

## 2013-07-14 ENCOUNTER — Telehealth: Payer: Self-pay | Admitting: *Deleted

## 2013-07-14 NOTE — Telephone Encounter (Signed)
Pt called and left a message on my vm asking that I return his phone call because he had some questions. The pt didn't say what he needed. Pt is currently at [facility] as a patient. I did call the pt back twice but the line was busy. I called his room directly (room # is [phone]) and had the operator transfer me to his room and the line was busy again

## 2013-07-16 ENCOUNTER — Encounter: Payer: Self-pay | Admitting: Family Medicine

## 2013-07-16 ENCOUNTER — Ambulatory Visit (INDEPENDENT_AMBULATORY_CARE_PROVIDER_SITE_OTHER): Payer: Commercial Managed Care - PPO | Admitting: Family Medicine

## 2013-07-16 VITALS — BP 120/77 | HR 101 | Wt 203.0 lb

## 2013-07-16 DIAGNOSIS — I472 Ventricular tachycardia: Secondary | ICD-10-CM | POA: Insufficient documentation

## 2013-07-16 DIAGNOSIS — I10 Essential (primary) hypertension: Secondary | ICD-10-CM

## 2013-07-16 LAB — COMPLETE METABOLIC PANEL WITH GFR
ALT: 42 U/L (ref 0–53)
AST: 23 U/L (ref 0–37)
Albumin: 3.3 g/dL — ABNORMAL LOW (ref 3.5–5.2)
Alkaline Phosphatase: 158 U/L — ABNORMAL HIGH (ref 39–117)
BUN: 46 mg/dL — ABNORMAL HIGH (ref 6–23)
Calcium: 9.3 mg/dL (ref 8.4–10.5)
Chloride: 89 mEq/L — ABNORMAL LOW (ref 96–112)
Creat: 1.31 mg/dL (ref 0.50–1.35)
GFR, Est African American: 67 mL/min
GFR, Est Non African American: 58 mL/min — ABNORMAL LOW
Glucose, Bld: 374 mg/dL — ABNORMAL HIGH (ref 70–99)
Potassium: 4.8 mEq/L (ref 3.5–5.3)
Total Bilirubin: 0.6 mg/dL (ref 0.3–1.2)

## 2013-07-16 LAB — MAGNESIUM: Magnesium: 1.5 mg/dL (ref 1.5–2.5)

## 2013-07-16 NOTE — Progress Notes (Signed)
CC: Carl Palmer is a 62 y.o. male is here for hospital f/u   Subjective: HPI:  Hospital followup admitted November 6 discharge November 12  Acute on chronic systolic congestive heart failure. Patient required aggressive diuresis required Lasix drip. At discharge Lasix was discontinued instead spironolactone and torsemide were started. Weight at discharge 200 pounds.  He has not filled his new diuretics instead took 400 mg of Lasix every 8 hours since arriving home yesterday. He reports some abdominal bloating but denies shortness of breath or peripheral edema.  Nonsustained ventricular tachycardia: He was asymptomatic when this occurred in the hospital it occurred both during his electrolyte abnormalities and also multiple times once the electrolytes were corrected. He is currently wearing a life vest but has not discharged. He denies rapid heartbeat, irregular heartbeat, lightheadedness, chest pain, nor confusion. He has followup with cardiology next week.  Hyponatremia and hypocalcemia with hypo-magnesium.  Electrolytes were replaced initially with IV formulations then maintained with oral preparations he has been prescribed calcium carbonate, magnesium oxalate however has not filled these prescriptions yet. At discharge potassium was 4.6, calcium 8.6, magnesium 1.4. He denies tremor that was present just prior to admission. Denies abdominal pain or confusion nor muscle weakness.  Type 2 diabetes: His sulfonylurea and metformin were discontinued while hospitalized he was started on Lantus taking 30 units every evening. He was tolerating this in the hospital however has not been able to afford it since discharge.   Review Of Systems Outlined In HPI  Past Medical History  Diagnosis Date  . History of myocardial infarction 10/17/2012  . Elevated blood pressure 10/17/2012     Family History  Problem Relation Age of Onset  . Hypertension Father      History  Substance Use Topics  .  Smoking status: Current Every Day Smoker  . Smokeless tobacco: Not on file  . Alcohol Use: Not on file     Objective: Filed Vitals:   07/16/13 1005  BP: 120/77  Pulse: 101    General: Alert and Oriented, No Acute Distress HEENT: Pupils equal, round, reactive to light. Conjunctivae clear. Moist mucous membranes pharynx unremarkable Lungs: Clear to auscultation bilaterally, no wheezing/ronchi/rales.  Comfortable work of breathing. Good air movement. Cardiac: Regular rate and rhythm. Normal S1/S2.  No murmurs, rubs, nor gallops.   Abdomen: Soft nontender Extremities: No peripheral edema.  Strong peripheral pulses.  Mental Status: No depression, anxiety, nor agitation. Skin: Warm and dry.  Assessment & Plan: Carl Palmer was seen today for hospital f/u.  Diagnoses and associated orders for this visit:  Essential hypertension, benign  NSVT (nonsustained ventricular tachycardia)  Hypomagnesemia - Magnesium  Hypocalcemia - COMPLETE METABOLIC PANEL WITH GFR    Essential hypertension: Controlled continue Coreg and lisinopril Congestive heart failure: Controlled, I stressed to him the importance of getting torsemide and spironolactone filled as soon as possible to replace Lasix which did not prevent his most recent acute on chronic congestive heart failure Type 2 diabetes: Uncontrolled, we will be providing him with 2 additional samples of Lantus with needles until he can get his prescription Lantus filled. Keep blood sugar diary fasting and postprandial to review in one month Nonsustained ventricular tachycardia: Controlled, continue to wear life vest and followup with cardiology next week Electrolyte disturbance: Rechecking electrolytes including magnesium today  Return in about 4 weeks (around 08/13/2013).

## 2013-07-20 ENCOUNTER — Other Ambulatory Visit: Payer: Self-pay | Admitting: *Deleted

## 2013-07-20 ENCOUNTER — Telehealth: Payer: Self-pay | Admitting: *Deleted

## 2013-07-20 NOTE — Telephone Encounter (Signed)
Lab order put in and faxed downstairs

## 2013-07-22 ENCOUNTER — Telehealth: Payer: Self-pay | Admitting: *Deleted

## 2013-07-22 NOTE — Telephone Encounter (Signed)
Pt called and states he has FMLA forms that needed to be filled out by his PCP. Pt is trying to  Get financial assistance. Advised pt to go ahead and drop forms off and Dr. Ivan Anchors can review them when he gets back

## 2013-07-27 ENCOUNTER — Telehealth: Payer: Self-pay | Admitting: *Deleted

## 2013-07-27 NOTE — Telephone Encounter (Signed)
Pt called and left a message stating his blood sugars are running high. Pt's wife states his sugars are running around 419. Pt checks his sugars at night before taking the insulin. Asked pt's wife if they have ever checked to see what his sugar was after they took insulin. He has not checked sugar to see what it is after taking the insulin

## 2013-07-27 NOTE — Telephone Encounter (Signed)
Carl Palmer, Can you confirm that he's still taking 30Units of lantus every evening, if that's correct and his blood sugars are still above 200 once he wakes up before eating then I'd advise him to increase his Lantus to 40 Units every evening.

## 2013-07-28 ENCOUNTER — Telehealth: Payer: Self-pay | Admitting: *Deleted

## 2013-07-28 MED ORDER — AMBULATORY NON FORMULARY MEDICATION: Status: AC

## 2013-07-28 NOTE — Telephone Encounter (Signed)
Yes his wife did say he was taking 30 units yesterday when I spoke with her. Called and notified spouse and she did confirm again that pt was taking 30 units of lantus. She will let pt know to increase to 40 units in the evening

## 2013-07-28 NOTE — Telephone Encounter (Signed)
Pt request rx for glucometer

## 2013-08-03 ENCOUNTER — Ambulatory Visit (INDEPENDENT_AMBULATORY_CARE_PROVIDER_SITE_OTHER): Payer: Commercial Managed Care - PPO | Admitting: Family Medicine

## 2013-08-03 ENCOUNTER — Encounter: Payer: Self-pay | Admitting: Family Medicine

## 2013-08-03 VITALS — BP 95/60 | HR 90 | Wt 185.0 lb

## 2013-08-03 DIAGNOSIS — M549 Dorsalgia, unspecified: Secondary | ICD-10-CM

## 2013-08-03 DIAGNOSIS — E119 Type 2 diabetes mellitus without complications: Secondary | ICD-10-CM

## 2013-08-03 DIAGNOSIS — E871 Hypo-osmolality and hyponatremia: Secondary | ICD-10-CM

## 2013-08-03 LAB — COMPLETE METABOLIC PANEL WITH GFR
ALT: 25 U/L (ref 0–53)
AST: 23 U/L (ref 0–37)
BUN: 36 mg/dL — ABNORMAL HIGH (ref 6–23)
CO2: 33 mEq/L — ABNORMAL HIGH (ref 19–32)
Creat: 1.06 mg/dL (ref 0.50–1.35)
GFR, Est African American: 87 mL/min
GFR, Est Non African American: 75 mL/min
Total Bilirubin: 0.7 mg/dL (ref 0.3–1.2)

## 2013-08-03 MED ORDER — METFORMIN HCL 1000 MG PO TABS: ORAL_TABLET | ORAL | Status: DC

## 2013-08-03 MED ORDER — AMBULATORY NON FORMULARY MEDICATION: Status: AC

## 2013-08-03 MED ORDER — HYDROCODONE-ACETAMINOPHEN 5-325 MG PO TABS: 1.0000 | ORAL_TABLET | Freq: Every evening | ORAL | Status: DC | PRN

## 2013-08-03 NOTE — Progress Notes (Signed)
CC: Carl Palmer is a 62 y.o. male is here for Hyperglycemia   Subjective: HPI:  Follow up type 2 diabetes: He is checking his blood sugar daily averaging between high 300s-450. He took leftover glipizide/metformin and noticed blood sugar came down to 180.  Denies hypoglycemic episodes. He is taking 40 units of Lantus every evening. He is trying to watch what he eats no formal physical activity. Denies polyuria polyphasia polydipsia poorly healing wounds nor vision loss  Followup hyponatremia: Continues to take spironolactone and torsemide on a daily basis for congestive heart failure. Denies confusion, motor sensory disturbances, cramping or muscle weakness.   Complains of moderate low back pain that has been present ever since having to wear life vest for nonsustained ventricular tachycardia. Pain is improved and almost absent if he takes vest off, pain is interfering with quality of sleep. No improvement with ibuprofen or Tylenol. Described only as pain moderate in severity Central low back denies recent injury or radiation of pain.   Review Of Systems Outlined In HPI  Past Medical History  Diagnosis Date  . History of myocardial infarction 10/17/2012  . Elevated blood pressure 10/17/2012     Family History  Problem Relation Age of Onset  . Hypertension Father      History  Substance Use Topics  . Smoking status: Current Every Day Smoker  . Smokeless tobacco: Not on file  . Alcohol Use: Not on file     Objective: Filed Vitals:   08/03/13 0859  BP: 95/60  Pulse: 90    General: Alert and Oriented, No Acute Distress HEENT: Pupils equal, round, reactive to light. Conjunctivae clear.  Moist mucous membranes pharynx unremarkable Lungs: Clear to auscultation bilaterally, no wheezing/ronchi/rales.  Comfortable work of breathing. Good air movement. Cardiac: Regular rate and rhythm. Normal S1/S2.  No murmurs, rubs, nor gallops.   Back: No midline spinous process tenderness pain  is not reproduced with palpation of paraspinal musculature in the lumbar region.  Extremities: No peripheral edema.  Strong peripheral pulses.  Mental Status: No depression, anxiety, nor agitation. Skin: Warm and dry.  Assessment & Plan: Carl Palmer was seen today for hyperglycemia.  Diagnoses and associated orders for this visit:  Diabetes - metFORMIN (GLUCOPHAGE) 1000 MG tablet; One by mouth twice a day for blood sugar control. - COMPLETE METABOLIC PANEL WITH GFR  Back pain - HYDROcodone-acetaminophen (NORCO/VICODIN) 5-325 MG per tablet; Take 1 tablet by mouth at bedtime as needed for moderate pain.  Hyponatremia - COMPLETE METABOLIC PANEL WITH GFR  Other Orders - AMBULATORY NON FORMULARY MEDICATION; Medication Name: Handicap placard for one year, diagnosis congestive heart failure.    Type 2 diabetes: Uncontrolled restart metformin after cardiac catheterization later this week.  Increase Lantus now 50 units every evening we discussed self titration of 2 units every 2 days until blood sugar is consistently below 120 fasting. Back pain: Vicodin only as needed for sleep Hyponatremia: Clinically controlled however due for repeat metabolic panel   Return in about 4 weeks (around 08/31/2013).

## 2013-08-03 NOTE — Patient Instructions (Signed)
   Self Titration of Long Acting Insulin (Lantus or Levemir)  The following strategy can be used to optimize your blood sugar control and requires only once a day blood sugar testing.  Assuming that you are taking your long acting insulin every evening, begin by recording your fasting blood sugar every morning along with the number of units of long acting insulin you used the night before.  If you notice that on two consecutive days your fasting blood sugar is above 120 then add 2 units of long acting insulin to your evening regimen.  Continue this last step, adding 2 units of long acting insulin every 2 days until you find your fasting blood sugar consistently remains below 120.  The idea is to slowly increase your long acting insulin to avoid hypoglycemia.   Example:   Fasting Blood Sugar Last Night's Units Added Units  160 20 Units -  158 20 Units 2 Units  148 22 Units -  142 22 Units 2 Units  115 24 Units -  117 24 Units -  113 24 Units -  114 24 Units -  118 24 Units -      

## 2013-08-07 ENCOUNTER — Telehealth: Payer: Self-pay | Admitting: *Deleted

## 2013-08-07 NOTE — Telephone Encounter (Signed)
FYI: pt's wife called to let you know that he is having open heart surgery today. 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

## 2013-08-10 ENCOUNTER — Ambulatory Visit: Payer: Commercial Managed Care - PPO | Admitting: Family Medicine

## 2013-08-14 ENCOUNTER — Encounter: Payer: Self-pay | Admitting: Family Medicine

## 2013-08-14 ENCOUNTER — Ambulatory Visit (INDEPENDENT_AMBULATORY_CARE_PROVIDER_SITE_OTHER): Payer: Commercial Managed Care - PPO | Admitting: Family Medicine

## 2013-08-14 VITALS — BP 118/68 | HR 102 | Temp 98.9°F | Wt 193.0 lb

## 2013-08-14 DIAGNOSIS — D649 Anemia, unspecified: Secondary | ICD-10-CM

## 2013-08-14 DIAGNOSIS — E875 Hyperkalemia: Secondary | ICD-10-CM

## 2013-08-14 NOTE — Progress Notes (Signed)
CC: Carl Palmer is a 62 y.o. male is here for hospital f/u   Subjective: HPI:  Hospital followup: Admitted December 4-9 for coronary artery bypass graft x3 which was successful without complications. He is here today with questions on what medications he should be taking he was confusion between our medications and [facility] discharge medication list.  On the day of discharge she had a potassium of 6.4 and a hemoglobin of 8.8 he tells me he required 2 units of packed red blood cells the day after his surgery on the fifth.  He has no complaints today other than medication clarification.  Denies shortness of breath, wheezing, chest pain other than discomfort at the incision site.  Denies fevers, chills. He does endorse weakness but this is improving on a daily basis since arriving back home.  Denies muscle cramps, motor or sensory disturbances nor irregular heartbeat   Review Of Systems Outlined In HPI  Past Medical History  Diagnosis Date  . History of myocardial infarction 10/17/2012  . Elevated blood pressure 10/17/2012     Family History  Problem Relation Age of Onset  . Hypertension Father      History  Substance Use Topics  . Smoking status: Current Every Day Smoker  . Smokeless tobacco: Not on file  . Alcohol Use: Not on file     Objective: Filed Vitals:   08/14/13 1100  BP: 118/68  Pulse: 102  Temp: 98.9 F (37.2 C)    General: Alert and Oriented, No Acute Distress HEENT: Pupils equal, round, reactive to light. Conjunctivae clear.  Moist mucous membranes pharynx unremarkable Lungs: Clear to auscultation bilaterally, no wheezing/ronchi/rales.  Comfortable work of breathing. Good air movement. Cardiac: Regular rate and rhythm. Normal S1/S2.  No murmurs, rubs, nor gallops.   Abdomen: Soft nontender Extremities: No peripheral edema.  Strong peripheral pulses.  Mental Status: No depression, anxiety, nor agitation. Skin: Warm and dry. Incision site on the abdomen and  sternum is clean dry and intact without signs of infection  Assessment & Plan: [name] was seen today for hospital f/u.  Diagnoses and associated orders for this visit:  Hyperkalemia - BASIC METABOLIC PANEL WITH GFR  Anemia - CBC    Hyperkalemia: Repeat basic metabolic panel he has stopped his lisinopril Anemia: Repeat hemoglobin today  We went over his medication list in detail stopping metformin but continuing glipizide/metformin combo, stopping Demadex per instructions from hospital. Continue furosemide stop Imdur.  25 minutes spent face-to-face during visit today of which at least 50% was counseling or coordinating care regarding hyperkalemia, anemia, hospital followup, medication regimen clarification.   No Follow-up on file.

## 2013-08-15 LAB — CBC
Hemoglobin: 9.4 g/dL — ABNORMAL LOW (ref 13.0–17.0)
MCH: 27.4 pg (ref 26.0–34.0)
MCV: 86.3 fL (ref 78.0–100.0)
Platelets: 226 10*3/uL (ref 150–400)
RBC: 3.43 MIL/uL — ABNORMAL LOW (ref 4.22–5.81)
RDW: 16.5 % — ABNORMAL HIGH (ref 11.5–15.5)

## 2013-08-15 LAB — BASIC METABOLIC PANEL WITH GFR
CO2: 29 mEq/L (ref 19–32)
Calcium: 8.7 mg/dL (ref 8.4–10.5)
GFR, Est Non African American: >89 mL/min
Glucose, Bld: 209 mg/dL — ABNORMAL HIGH (ref 70–99)
Potassium: 5.2 mEq/L (ref 3.5–5.3)
Sodium: 136 mEq/L (ref 135–145)

## 2013-08-17 ENCOUNTER — Encounter: Payer: Self-pay | Admitting: *Deleted

## 2013-09-16 ENCOUNTER — Ambulatory Visit (INDEPENDENT_AMBULATORY_CARE_PROVIDER_SITE_OTHER): Payer: Commercial Managed Care - PPO | Admitting: Family Medicine

## 2013-09-16 ENCOUNTER — Encounter: Payer: Self-pay | Admitting: Family Medicine

## 2013-09-16 VITALS — BP 107/69 | HR 109 | Wt 180.0 lb

## 2013-09-16 DIAGNOSIS — E119 Type 2 diabetes mellitus without complications: Secondary | ICD-10-CM

## 2013-09-16 DIAGNOSIS — Z951 Presence of aortocoronary bypass graft: Secondary | ICD-10-CM | POA: Insufficient documentation

## 2013-09-16 DIAGNOSIS — R0789 Other chest pain: Secondary | ICD-10-CM | POA: Insufficient documentation

## 2013-09-16 DIAGNOSIS — M549 Dorsalgia, unspecified: Secondary | ICD-10-CM

## 2013-09-16 DIAGNOSIS — I10 Essential (primary) hypertension: Secondary | ICD-10-CM

## 2013-09-16 DIAGNOSIS — R079 Chest pain, unspecified: Secondary | ICD-10-CM

## 2013-09-16 LAB — POCT GLYCOSYLATED HEMOGLOBIN (HGB A1C): HEMOGLOBIN A1C: 8.1

## 2013-09-16 MED ORDER — HYDROCODONE-ACETAMINOPHEN 5-325 MG PO TABS: 1.0000 | ORAL_TABLET | Freq: Every evening | ORAL | Status: DC | PRN

## 2013-09-16 MED ORDER — CARVEDILOL 3.125 MG PO TABS: 3.1250 mg | ORAL_TABLET | Freq: Two times a day (BID) | ORAL | Status: AC

## 2013-09-16 MED ORDER — GLIPIZIDE-METFORMIN HCL 5-500 MG PO TABS: ORAL_TABLET | ORAL | Status: AC

## 2013-09-16 MED ORDER — GLIPIZIDE-METFORMIN HCL 5-500 MG PO TABS: ORAL_TABLET | ORAL | Status: DC

## 2013-09-16 NOTE — Addendum Note (Signed)
Addended by: Wyline BeadyMCCRIMMON, Rayma Hegg C on: 09/16/2013 04:19 PM   Modules accepted: Orders

## 2013-09-16 NOTE — Progress Notes (Signed)
CC: Carl Palmer is a 63 y.o. male is here for Hypertension and Diabetes   Subjective: HPI:  Followup type 2 diabetes: He continues on glipizide/metformin taking 1 tablet in the morning and one in the evening. Continues on Lantus did not increase dosage to 50 units every evening remains on 40 units every evening due to cost of medication. Denies polyuria polyphagia polydipsia nor poorly healing wounds. Denies vision loss. Fasting blood sugar has been checked once since I saw him last was 112, only one random blood sugar check which was 180 last week.  Followup essential hypertension: Requesting refills on carvedilol only one outside blood pressures to report that today's cardiology office 90/72 per his report. Denies chest pain, orthopnea, PND, irregular heartbeat, motor sensory disturbances  Followup CABG in the setting of CHF: Was recently seen by his cardiology team earlier this morning he continues to wear his life vest there is no determination on whether or not he will be getting a internal defibrillator  Complaints of daily chest pain localized to the sternum at the site of his surgical wound. It is tender to the touch or when leaning over or taking deep breaths or coughing. Pain is interfering with sleep, described only as pain nonradiating and mild to moderate in severity  Review Of Systems Outlined In HPI  Past Medical History  Diagnosis Date  . History of myocardial infarction 10/17/2012  . Elevated blood pressure 10/17/2012     Family History  Problem Relation Age of Onset  . Hypertension Father      History  Substance Use Topics  . Smoking status: Current Every Day Smoker  . Smokeless tobacco: Not on file  . Alcohol Use: Not on file     Objective: Filed Vitals:   09/16/13 1433  BP: 107/69  Pulse: 109    General: Alert and Oriented, No Acute Distress HEENT: Pupils equal, round, reactive to light. Conjunctivae clear.  Moist mucous membranes pharynx  unremarkable Lungs: Comfortable work of breathing no rhonchi Rales nor wheezing, there is a friction rub in the right posterior lower lung field Cardiac: Regular rate and rhythm. Normal S1/S2.  No murmurs, rubs, nor gallops.   Abdomen: Soft nontender Extremities: No peripheral edema.  Strong peripheral pulses.  Mental Status: No depression, anxiety, nor agitation. Skin: Warm and dry.  Assessment & Plan: Carl Palmer was seen today for hypertension and diabetes.  Diagnoses and associated orders for this visit:  Type 2 diabetes mellitus - Discontinue: glipiZIDE-metformin (METAGLIP) 5-500 MG per tablet; Two by mouth every morning and one by mouth every evening. - glipiZIDE-metformin (METAGLIP) 5-500 MG per tablet; Two by mouth every morning and one by mouth every evening.  Essential hypertension, benign - carvedilol (COREG) 3.125 MG tablet; Take 1 tablet (3.125 mg total) by mouth 2 (two) times daily.  S/P CABG (coronary artery bypass graft)  Back pain - HYDROcodone-acetaminophen (NORCO/VICODIN) 5-325 MG per tablet; Take 1 tablet by mouth at bedtime as needed for moderate pain.  Sternal pain      Return in about 4 weeks (around 10/14/2013).   type 2 diabetes: Uncontrolled with A1c of 8.1 I have asked him to increase his Lantus to 50 units every evening he would prefer to adjust oral medications therefore take additional dose of metformin glipizide every morning repeat A1c 3 months, followup one month with fasting blood sugars Essential hypertension: Controlled continue carvedilol and spironolactone Sternal pain: Continue hydrocodone as needed for sleep Tylenol during the day, please disregard back pain diagnosis  above

## 2013-10-01 ENCOUNTER — Telehealth: Payer: Self-pay | Admitting: *Deleted

## 2013-10-01 DIAGNOSIS — M549 Dorsalgia, unspecified: Secondary | ICD-10-CM

## 2013-10-01 MED ORDER — HYDROCODONE-ACETAMINOPHEN 5-325 MG PO TABS: 1.0000 | ORAL_TABLET | Freq: Every evening | ORAL | Status: AC | PRN

## 2013-10-01 NOTE — Telephone Encounter (Signed)
Pt notified and rx up front; pt also states there is knot on his leg where they took a vein to do his bypass surgery. He states the knot is getting bigger he wants to know if he should be concerned. I mentioned the possibility of a superficial blood clot but I wasn't sure if that was appropriate so I talked with Dr. Linford ArnoldMetheney and she did agree just to have Dr. Ivan AnchorsHommel lay eyes on this patient just to make sure that this wasn't the case. Pt denies tenderness to the touch or redness around the sight. He denies the area feeling warm to the touch. I went ahead and scheduled an appt for him tomorrow at 830.

## 2013-10-01 NOTE — Telephone Encounter (Signed)
rx is upfront.

## 2013-10-01 NOTE — Telephone Encounter (Signed)
I totally agree to stop insulin at this time.  Continue on the metformin combo tablet and let me know how sugars are doing at the beginning of next week. New hydrocodone rx has been printed, take only as directed.

## 2013-10-01 NOTE — Telephone Encounter (Signed)
Pt called and states his BS is "all over the place." BS yesterday am before eating it 40,last pm it was 86. Pt states he didn't take insulin last pm but he did take metformin and this am his BS was 87. At 1015 this am his BS was 64. Pt states he is not going to take his insulin. Please advise  Also pt states he is out of the hydrocodone. Pt states he has been taking 2 pills every night for his back pain. I did tell the patient that it is too early to refill this medication since it was just filled on 1/14. I did tell him Dr. Ivan AnchorsHommel may change the frequency in which he takes it if he sees the need to but this may require an office visit.Please advise

## 2013-10-02 ENCOUNTER — Encounter: Payer: Self-pay | Admitting: Family Medicine

## 2013-10-02 ENCOUNTER — Ambulatory Visit (INDEPENDENT_AMBULATORY_CARE_PROVIDER_SITE_OTHER): Payer: Commercial Managed Care - PPO | Admitting: Family Medicine

## 2013-10-02 VITALS — BP 119/74 | HR 92 | Wt 196.0 lb

## 2013-10-02 DIAGNOSIS — E119 Type 2 diabetes mellitus without complications: Secondary | ICD-10-CM

## 2013-10-02 DIAGNOSIS — I898 Other specified noninfective disorders of lymphatic vessels and lymph nodes: Secondary | ICD-10-CM

## 2013-10-02 MED ORDER — ESOMEPRAZOLE MAGNESIUM 40 MG PO CPDR: 40.0000 mg | DELAYED_RELEASE_CAPSULE | Freq: Every day | ORAL | Status: AC

## 2013-10-02 NOTE — Progress Notes (Signed)
CC: Carl Palmer is a 63 y.o. male is here for check knot on leg   Subjective: HPI:  Followup type 2 diabetes: Over the last week he has noticed that fasting blood sugars have been consistently below 90, he's actually had one morning where it was in the 40s. He took his last dose of Lantus on Tuesday night since then he has not had any hypoglycemic episodes and all fasting blood sugars have been below 120. 2 are postprandial blood sugars have remained below 160 since he stopped Lantus. He is still on metformin/glipizide 2 pills every morning and one in the evening. Denies polyuria polyphagia nor poorly healing wounds.  Points out some swelling on the inside of his right knee that has been present ever since his coronary artery bypass graft. The swelling seems to be worse the longer he stands during the day. It'll slowly shrink if he lies down for a long period of time. It is painless does not cause him any discomfort but he wonders if he should be worried about it. Denies fevers, chills, nor overlying skin changes at the site of discomfort     Review Of Systems Outlined In HPI  Past Medical History  Diagnosis Date  . History of myocardial infarction 10/17/2012  . Elevated blood pressure 10/17/2012     Family History  Problem Relation Age of Onset  . Hypertension Father      History  Substance Use Topics  . Smoking status: Current Every Day Smoker  . Smokeless tobacco: Not on file  . Alcohol Use: Not on file     Objective: Filed Vitals:   10/02/13 0841  BP: 119/74  Pulse: 92    General: Alert and Oriented, No Acute Distress HEENT: Pupils equal, round, reactive to light. Conjunctivae clear. Moist mucous membranes pharynx unremarkable Lungs: Clear to auscultation bilaterally, no wheezing/ronchi/rales.  Comfortable work of breathing. Good air movement. Cardiac: Regular rate and rhythm. Normal S1/S2.  No murmurs, rubs, nor gallops.   Extremities: No peripheral edema.  Strong  peripheral pulses. Just above the right knee at the vein harvesting site for his coronary artery bypass graft there is a nontender firm 1.5 centimeter diameter nodule which is nonpulsatile and noncompressible. Mental Status: No depression, anxiety, nor agitation. Skin: Warm and dry.  Assessment & Plan: Carl Palmer was seen today for check knot on leg.  Diagnoses and associated orders for this visit:  Type 2 diabetes mellitus  Lymphocele  Other Orders - esomeprazole (NEXIUM) 40 MG capsule; Take 1 capsule (40 mg total) by mouth daily at 12 noon.    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  He shares the sad news that he will have to now go to [facility] due to Medicaid status.  There was some miscommunication during the checking process today that he no longer has insurance and is only covered by Medicaid, I will not charge for today's visit since he did not know that we do not accept Medicaid while he was checking in for this visit.  Return if symptoms worsen or fail to improve.

## 2013-10-08 ENCOUNTER — Telehealth: Payer: Self-pay | Admitting: *Deleted

## 2013-10-08 NOTE — Telephone Encounter (Signed)
Nexium denied for PA. Pt must have tried and failed of preferred medications.  Meyer CoryMisty Ahmad, LPN

## 2013-10-14 ENCOUNTER — Telehealth: Payer: Self-pay | Admitting: *Deleted

## 2013-10-21 NOTE — Telephone Encounter (Signed)
closed

## 2013-10-26 ENCOUNTER — Other Ambulatory Visit: Payer: Self-pay | Admitting: Family Medicine

## 2013-10-26 NOTE — Telephone Encounter (Signed)
Dr. Ivan AnchorsHommel this patient is not on this med as far as I can tell and at last visit in 09/2013 states he can no longer be a patient here due to no insurance and only Medicaid .

## 2013-11-10 IMAGING — CR DG CHEST 2V
2 series · 2 of 2 positions shown · non-contrast
Comparison: None.

CLINICAL DATA: Worsening cough and shortness of breath.  Smoker.

CHEST - 2 VIEW

[view not recorded (1 of 2)]
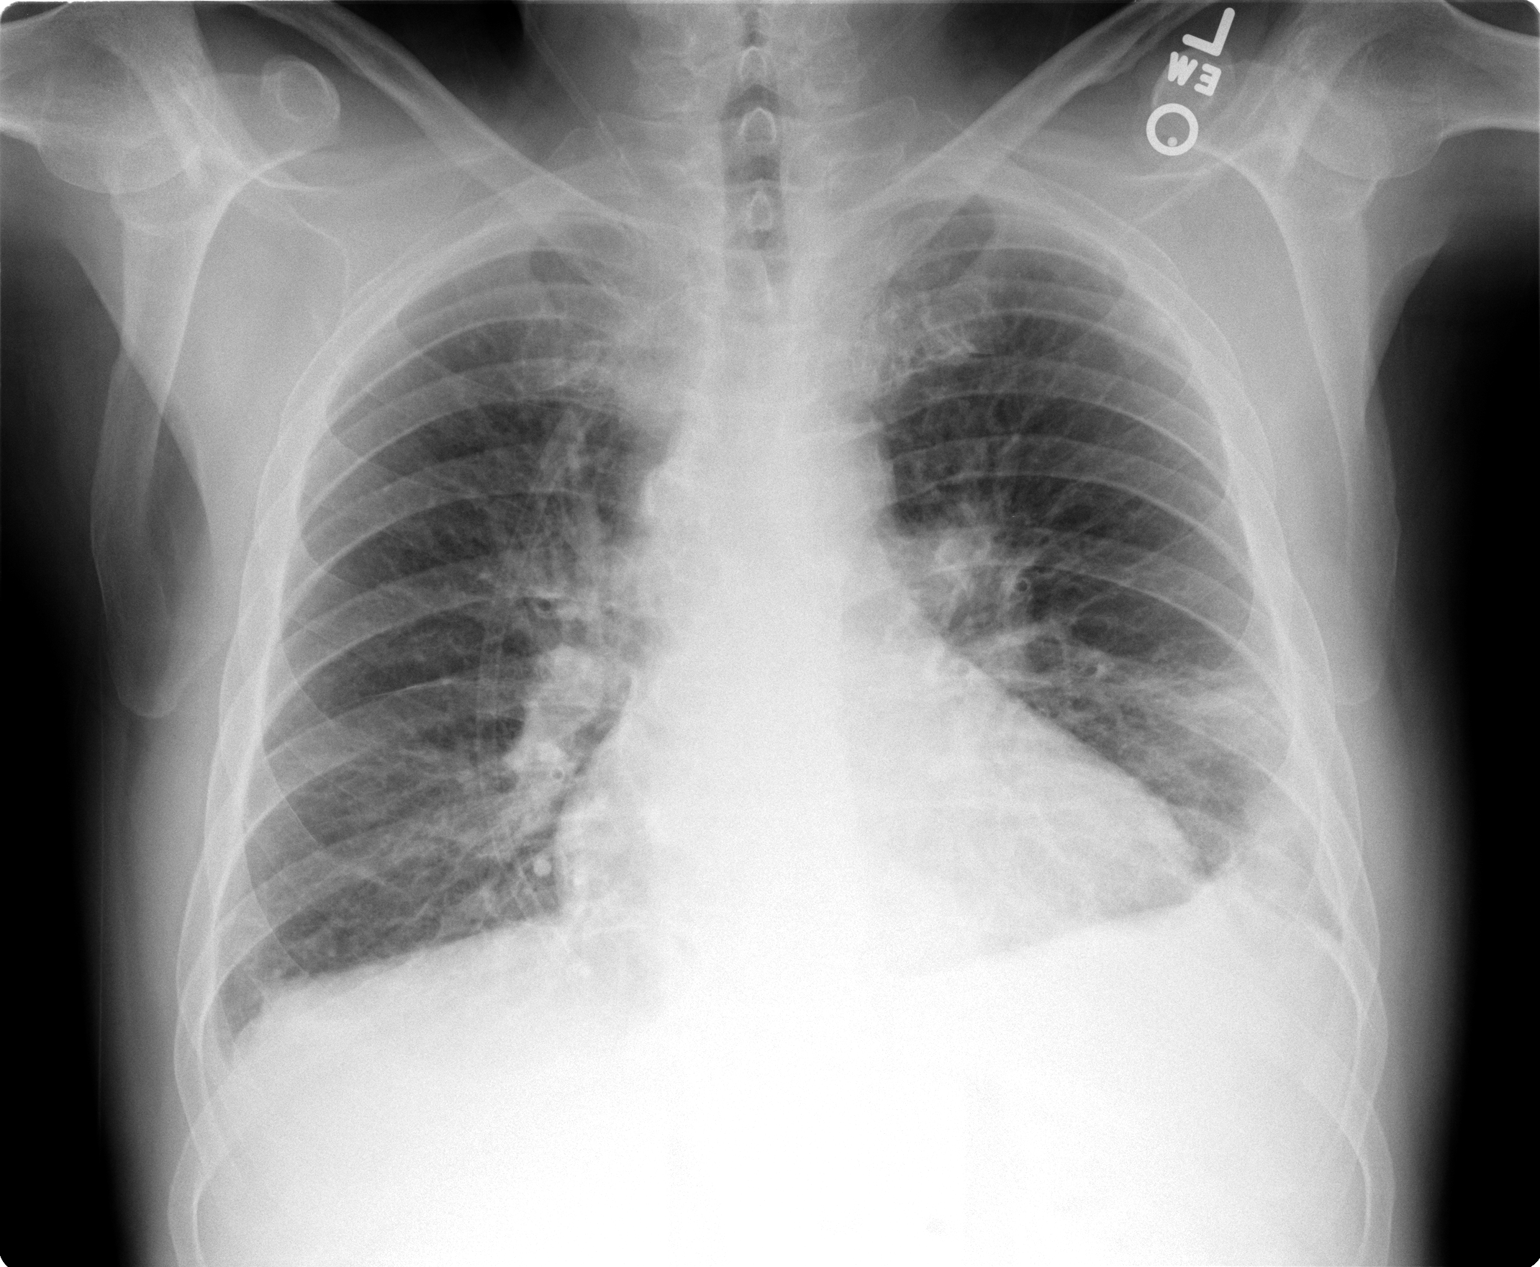

[view not recorded (2 of 2)]
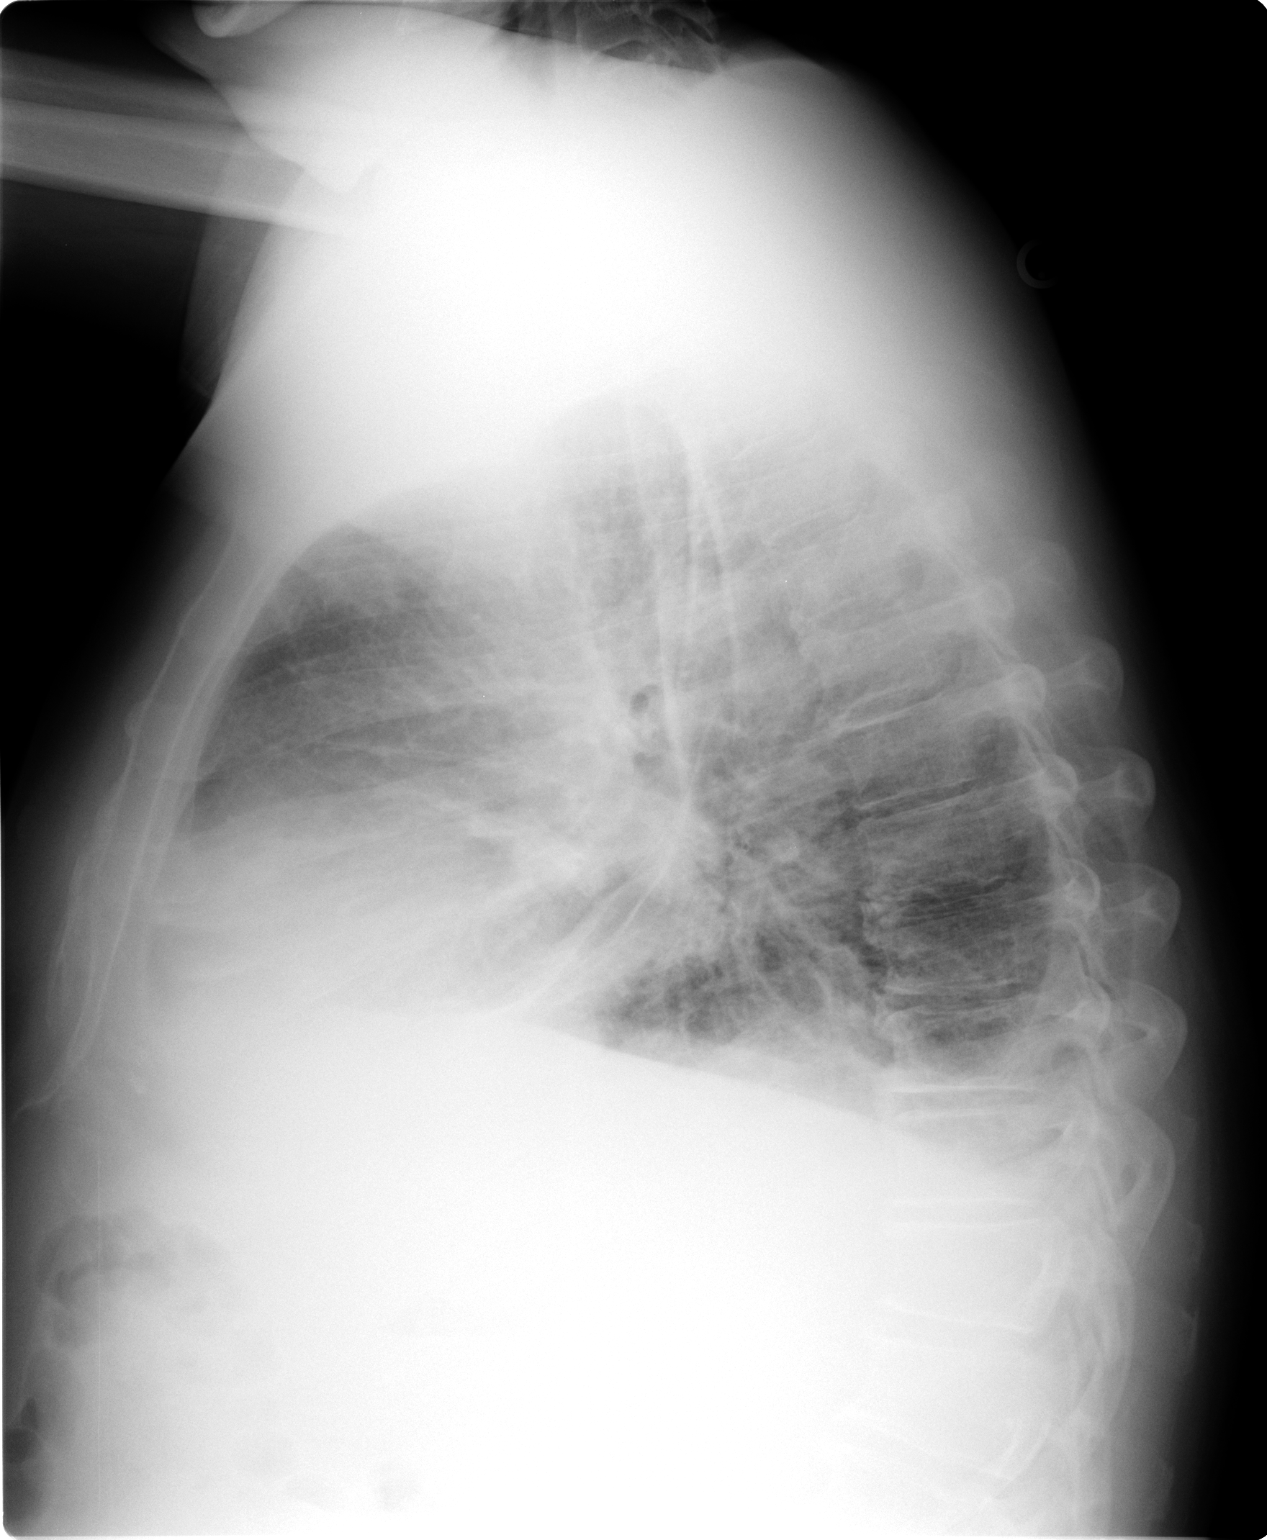

[2 of 2 positions shown; findings below may reference images not displayed]

FINDINGS: Trachea is midline.  Heart size normal.  Left basilar
airspace disease with tiny bilateral pleural effusions.  Minimal
right basilar airspace disease.  Curvilinear density seen in the
medial base of the left hemithorax may represent a hiatal hernia.
IMPRESSION: 1.  Bibasilar air space disease, left greater than right, possibly
due to pneumonia.  Follow-up to clearing is recommended.
2.  Tiny bilateral pleural effusions.

## 2013-11-25 ENCOUNTER — Other Ambulatory Visit: Payer: Self-pay | Admitting: Family Medicine

## 2014-07-07 ENCOUNTER — Telehealth: Payer: Self-pay

## 2014-07-07 NOTE — Telephone Encounter (Signed)
I have now removed my name as his PCP within [employer]

## 2014-07-07 NOTE — Telephone Encounter (Signed)
Called to schedule patient for follow up appointment for his DM. Wife states his insurance is now medicaid. He had to establish care with a new PCP per his Medicaid rep.

## 2015-02-28 ENCOUNTER — Other Ambulatory Visit: Payer: Self-pay
